# Patient Record
Sex: Female | Born: 1940 | Race: Black or African American | Hispanic: No | Marital: Married | Smoking: Former smoker
Health system: Southern US, Community
[De-identification: ages and names within clinical notes are randomized; demographics above are authoritative.]

## PROBLEM LIST (undated history)

## (undated) DIAGNOSIS — I471 Supraventricular tachycardia: Secondary | ICD-10-CM

## (undated) DIAGNOSIS — I428 Other cardiomyopathies: Secondary | ICD-10-CM

## (undated) DIAGNOSIS — I10 Essential (primary) hypertension: Secondary | ICD-10-CM

## (undated) DIAGNOSIS — M8580 Other specified disorders of bone density and structure, unspecified site: Secondary | ICD-10-CM

## (undated) DIAGNOSIS — D219 Benign neoplasm of connective and other soft tissue, unspecified: Secondary | ICD-10-CM

## (undated) DIAGNOSIS — I340 Nonrheumatic mitral (valve) insufficiency: Secondary | ICD-10-CM

## (undated) DIAGNOSIS — I4719 Other supraventricular tachycardia: Secondary | ICD-10-CM

## (undated) DIAGNOSIS — C801 Malignant (primary) neoplasm, unspecified: Secondary | ICD-10-CM

## (undated) DIAGNOSIS — I447 Left bundle-branch block, unspecified: Secondary | ICD-10-CM

## (undated) DIAGNOSIS — K219 Gastro-esophageal reflux disease without esophagitis: Secondary | ICD-10-CM

## (undated) HISTORY — DX: Left bundle-branch block, unspecified: I44.7

## (undated) HISTORY — DX: Other supraventricular tachycardia: I47.19

## (undated) HISTORY — DX: Gastro-esophageal reflux disease without esophagitis: K21.9

## (undated) HISTORY — DX: Essential (primary) hypertension: I10

## (undated) HISTORY — DX: Supraventricular tachycardia: I47.1

## (undated) HISTORY — PX: TUBAL LIGATION: SHX77

## (undated) HISTORY — DX: Nonrheumatic mitral (valve) insufficiency: I34.0

## (undated) HISTORY — PX: OOPHORECTOMY: SHX86

## (undated) HISTORY — DX: Other specified disorders of bone density and structure, unspecified site: M85.80

## (undated) HISTORY — DX: Malignant (primary) neoplasm, unspecified: C80.1

## (undated) HISTORY — DX: Other cardiomyopathies: I42.8

## (undated) HISTORY — DX: Benign neoplasm of connective and other soft tissue, unspecified: D21.9

---

## 1988-05-11 HISTORY — PX: ABDOMINAL HYSTERECTOMY: SHX81

## 1998-05-08 ENCOUNTER — Other Ambulatory Visit: Admission: RE | Admit: 1998-05-08 | Discharge: 1998-05-08 | Payer: Self-pay | Admitting: Obstetrics and Gynecology

## 1999-05-15 ENCOUNTER — Other Ambulatory Visit: Admission: RE | Admit: 1999-05-15 | Discharge: 1999-05-15 | Payer: Self-pay | Admitting: Obstetrics and Gynecology

## 2000-05-19 ENCOUNTER — Other Ambulatory Visit: Admission: RE | Admit: 2000-05-19 | Discharge: 2000-05-19 | Payer: Self-pay | Admitting: Obstetrics and Gynecology

## 2001-05-27 ENCOUNTER — Other Ambulatory Visit: Admission: RE | Admit: 2001-05-27 | Discharge: 2001-05-27 | Payer: Self-pay | Admitting: Obstetrics and Gynecology

## 2002-05-29 ENCOUNTER — Other Ambulatory Visit: Admission: RE | Admit: 2002-05-29 | Discharge: 2002-05-29 | Payer: Self-pay | Admitting: Obstetrics and Gynecology

## 2003-04-09 ENCOUNTER — Ambulatory Visit (HOSPITAL_COMMUNITY): Admission: RE | Admit: 2003-04-09 | Discharge: 2003-04-09 | Payer: Self-pay | Admitting: Gastroenterology

## 2003-06-26 ENCOUNTER — Other Ambulatory Visit: Admission: RE | Admit: 2003-06-26 | Discharge: 2003-06-26 | Payer: Self-pay | Admitting: Obstetrics and Gynecology

## 2004-05-09 ENCOUNTER — Encounter: Admission: RE | Admit: 2004-05-09 | Discharge: 2004-05-09 | Payer: Self-pay | Admitting: Gastroenterology

## 2004-07-14 ENCOUNTER — Other Ambulatory Visit: Admission: RE | Admit: 2004-07-14 | Discharge: 2004-07-14 | Payer: Self-pay | Admitting: Addiction Medicine

## 2005-07-15 ENCOUNTER — Other Ambulatory Visit: Admission: RE | Admit: 2005-07-15 | Discharge: 2005-07-15 | Payer: Self-pay | Admitting: Obstetrics and Gynecology

## 2006-05-11 DIAGNOSIS — C801 Malignant (primary) neoplasm, unspecified: Secondary | ICD-10-CM

## 2006-05-11 HISTORY — PX: BREAST SURGERY: SHX581

## 2006-05-11 HISTORY — DX: Malignant (primary) neoplasm, unspecified: C80.1

## 2006-07-20 ENCOUNTER — Other Ambulatory Visit: Admission: RE | Admit: 2006-07-20 | Discharge: 2006-07-20 | Payer: Self-pay | Admitting: Obstetrics and Gynecology

## 2006-11-19 ENCOUNTER — Inpatient Hospital Stay (HOSPITAL_COMMUNITY): Admission: RE | Admit: 2006-11-19 | Discharge: 2006-11-22 | Payer: Self-pay | Admitting: Surgery

## 2006-11-19 ENCOUNTER — Encounter (INDEPENDENT_AMBULATORY_CARE_PROVIDER_SITE_OTHER): Payer: Self-pay | Admitting: Surgery

## 2006-12-07 ENCOUNTER — Ambulatory Visit: Payer: Self-pay | Admitting: Oncology

## 2006-12-15 LAB — CBC WITH DIFFERENTIAL/PLATELET
Basophils Absolute: 0 10*3/uL (ref 0.0–0.1)
EOS%: 2.8 % (ref 0.0–7.0)
Eosinophils Absolute: 0.2 10*3/uL (ref 0.0–0.5)
HGB: 10.5 g/dL — ABNORMAL LOW (ref 11.6–15.9)
MCH: 27.3 pg (ref 26.0–34.0)
NEUT#: 5.4 10*3/uL (ref 1.5–6.5)
RDW: 15.7 % — ABNORMAL HIGH (ref 11.3–14.5)
lymph#: 1.5 10*3/uL (ref 0.9–3.3)

## 2006-12-19 LAB — LACTATE DEHYDROGENASE: LDH: 100 U/L (ref 94–250)

## 2006-12-19 LAB — COMPREHENSIVE METABOLIC PANEL
AST: 14 U/L (ref 0–37)
Albumin: 3.8 g/dL (ref 3.5–5.2)
BUN: 11 mg/dL (ref 6–23)
Calcium: 9.5 mg/dL (ref 8.4–10.5)
Chloride: 104 mEq/L (ref 96–112)
Potassium: 3.9 mEq/L (ref 3.5–5.3)
Sodium: 139 mEq/L (ref 135–145)
Total Protein: 7.2 g/dL (ref 6.0–8.3)

## 2007-03-07 ENCOUNTER — Ambulatory Visit (HOSPITAL_COMMUNITY): Admission: RE | Admit: 2007-03-07 | Discharge: 2007-03-07 | Payer: Self-pay | Admitting: Oncology

## 2007-03-08 ENCOUNTER — Encounter: Payer: Self-pay | Admitting: Oncology

## 2007-03-08 ENCOUNTER — Ambulatory Visit: Payer: Self-pay

## 2007-03-23 ENCOUNTER — Ambulatory Visit: Payer: Self-pay | Admitting: Oncology

## 2007-03-25 LAB — COMPREHENSIVE METABOLIC PANEL
AST: 15 U/L (ref 0–37)
Alkaline Phosphatase: 65 U/L (ref 39–117)
BUN: 11 mg/dL (ref 6–23)
Calcium: 9.6 mg/dL (ref 8.4–10.5)
Creatinine, Ser: 0.95 mg/dL (ref 0.40–1.20)
Total Bilirubin: 0.5 mg/dL (ref 0.3–1.2)

## 2007-03-25 LAB — CBC WITH DIFFERENTIAL/PLATELET
BASO%: 0.6 % (ref 0.0–2.0)
Basophils Absolute: 0 10*3/uL (ref 0.0–0.1)
EOS%: 3 % (ref 0.0–7.0)
HCT: 33.6 % — ABNORMAL LOW (ref 34.8–46.6)
HGB: 11.4 g/dL — ABNORMAL LOW (ref 11.6–15.9)
LYMPH%: 21.2 % (ref 14.0–48.0)
MCH: 26 pg (ref 26.0–34.0)
MCHC: 33.8 g/dL (ref 32.0–36.0)
MCV: 76.9 fL — ABNORMAL LOW (ref 81.0–101.0)
MONO%: 5.8 % (ref 0.0–13.0)
NEUT%: 69.4 % (ref 39.6–76.8)

## 2007-03-25 LAB — CANCER ANTIGEN 27.29: CA 27.29: 9 U/mL (ref 0–39)

## 2007-05-26 ENCOUNTER — Ambulatory Visit: Payer: Self-pay | Admitting: Oncology

## 2007-08-09 ENCOUNTER — Other Ambulatory Visit: Admission: RE | Admit: 2007-08-09 | Discharge: 2007-08-09 | Payer: Self-pay | Admitting: Obstetrics and Gynecology

## 2007-09-21 ENCOUNTER — Ambulatory Visit: Payer: Self-pay | Admitting: Oncology

## 2008-03-19 ENCOUNTER — Ambulatory Visit: Payer: Self-pay | Admitting: Oncology

## 2008-03-21 LAB — CBC WITH DIFFERENTIAL/PLATELET
Basophils Absolute: 0 10*3/uL (ref 0.0–0.1)
Eosinophils Absolute: 0.3 10*3/uL (ref 0.0–0.5)
HGB: 12.3 g/dL (ref 11.6–15.9)
LYMPH%: 27.7 % (ref 14.0–48.0)
MCV: 80.8 fL — ABNORMAL LOW (ref 81.0–101.0)
MONO%: 8.5 % (ref 0.0–13.0)
NEUT#: 3.5 10*3/uL (ref 1.5–6.5)
Platelets: 250 10*3/uL (ref 145–400)

## 2008-03-22 LAB — LACTATE DEHYDROGENASE: LDH: 133 U/L (ref 94–250)

## 2008-03-22 LAB — VITAMIN D 25 HYDROXY (VIT D DEFICIENCY, FRACTURES): Vit D, 25-Hydroxy: 26 ng/mL — ABNORMAL LOW (ref 30–89)

## 2008-03-22 LAB — COMPREHENSIVE METABOLIC PANEL
CO2: 20 mEq/L (ref 19–32)
Creatinine, Ser: 0.98 mg/dL (ref 0.40–1.20)
Glucose, Bld: 86 mg/dL (ref 70–99)
Total Bilirubin: 0.5 mg/dL (ref 0.3–1.2)

## 2008-07-11 ENCOUNTER — Ambulatory Visit: Payer: Self-pay | Admitting: Obstetrics and Gynecology

## 2008-08-21 ENCOUNTER — Encounter: Payer: Self-pay | Admitting: Obstetrics and Gynecology

## 2008-08-21 ENCOUNTER — Ambulatory Visit: Payer: Self-pay | Admitting: Obstetrics and Gynecology

## 2008-08-21 ENCOUNTER — Other Ambulatory Visit: Admission: RE | Admit: 2008-08-21 | Discharge: 2008-08-21 | Payer: Self-pay | Admitting: Obstetrics and Gynecology

## 2008-08-29 ENCOUNTER — Ambulatory Visit: Payer: Self-pay | Admitting: Obstetrics and Gynecology

## 2008-09-25 ENCOUNTER — Ambulatory Visit: Payer: Self-pay | Admitting: Oncology

## 2008-09-27 LAB — CBC WITH DIFFERENTIAL/PLATELET
BASO%: 0.1 % (ref 0.0–2.0)
LYMPH%: 37 % (ref 14.0–49.7)
MCHC: 33.4 g/dL (ref 31.5–36.0)
MCV: 79.5 fL (ref 79.5–101.0)
MONO#: 0.4 10*3/uL (ref 0.1–0.9)
MONO%: 5.8 % (ref 0.0–14.0)
Platelets: 249 10*3/uL (ref 145–400)
RBC: 4.63 10*6/uL (ref 3.70–5.45)
WBC: 7.1 10*3/uL (ref 3.9–10.3)
nRBC: 0 % (ref 0–0)

## 2008-09-28 LAB — COMPREHENSIVE METABOLIC PANEL
ALT: 10 U/L (ref 0–35)
AST: 16 U/L (ref 0–37)
Alkaline Phosphatase: 65 U/L (ref 39–117)
Creatinine, Ser: 0.96 mg/dL (ref 0.40–1.20)
Total Bilirubin: 0.3 mg/dL (ref 0.3–1.2)

## 2008-10-02 ENCOUNTER — Ambulatory Visit (HOSPITAL_COMMUNITY): Admission: RE | Admit: 2008-10-02 | Discharge: 2008-10-02 | Payer: Self-pay | Admitting: Oncology

## 2009-03-29 ENCOUNTER — Ambulatory Visit: Payer: Self-pay | Admitting: Oncology

## 2009-04-02 LAB — CBC WITH DIFFERENTIAL/PLATELET
Basophils Absolute: 0 10*3/uL (ref 0.0–0.1)
EOS%: 4.9 % (ref 0.0–7.0)
Eosinophils Absolute: 0.3 10*3/uL (ref 0.0–0.5)
HGB: 11.9 g/dL (ref 11.6–15.9)
MONO#: 0.4 10*3/uL (ref 0.1–0.9)
NEUT#: 3.8 10*3/uL (ref 1.5–6.5)
RDW: 15.5 % — ABNORMAL HIGH (ref 11.2–14.5)
WBC: 6.6 10*3/uL (ref 3.9–10.3)
lymph#: 2 10*3/uL (ref 0.9–3.3)

## 2009-04-03 LAB — COMPREHENSIVE METABOLIC PANEL
AST: 17 U/L (ref 0–37)
Albumin: 4.5 g/dL (ref 3.5–5.2)
BUN: 14 mg/dL (ref 6–23)
Calcium: 9.6 mg/dL (ref 8.4–10.5)
Chloride: 104 mEq/L (ref 96–112)
Glucose, Bld: 97 mg/dL (ref 70–99)
Potassium: 4.1 mEq/L (ref 3.5–5.3)

## 2009-08-26 ENCOUNTER — Ambulatory Visit: Payer: Self-pay | Admitting: Obstetrics and Gynecology

## 2009-09-19 ENCOUNTER — Ambulatory Visit: Payer: Self-pay | Admitting: Obstetrics and Gynecology

## 2009-09-26 ENCOUNTER — Ambulatory Visit: Payer: Self-pay | Admitting: Obstetrics and Gynecology

## 2010-02-14 ENCOUNTER — Inpatient Hospital Stay (HOSPITAL_COMMUNITY): Admission: EM | Admit: 2010-02-14 | Discharge: 2010-02-17 | Payer: Self-pay | Admitting: Internal Medicine

## 2010-02-15 ENCOUNTER — Encounter (INDEPENDENT_AMBULATORY_CARE_PROVIDER_SITE_OTHER): Payer: Self-pay | Admitting: Interventional Cardiology

## 2010-03-05 ENCOUNTER — Encounter (INDEPENDENT_AMBULATORY_CARE_PROVIDER_SITE_OTHER): Payer: Self-pay | Admitting: Interventional Cardiology

## 2010-03-05 ENCOUNTER — Ambulatory Visit (HOSPITAL_COMMUNITY): Admission: RE | Admit: 2010-03-05 | Discharge: 2010-03-05 | Payer: Self-pay | Admitting: Interventional Cardiology

## 2010-03-31 ENCOUNTER — Ambulatory Visit: Payer: Self-pay | Admitting: Oncology

## 2010-04-02 LAB — CBC WITH DIFFERENTIAL/PLATELET
BASO%: 0.4 % (ref 0.0–2.0)
Basophils Absolute: 0 10*3/uL (ref 0.0–0.1)
EOS%: 4.4 % (ref 0.0–7.0)
Eosinophils Absolute: 0.2 10*3/uL (ref 0.0–0.5)
HCT: 35.1 % (ref 34.8–46.6)
HGB: 11.5 g/dL — ABNORMAL LOW (ref 11.6–15.9)
LYMPH%: 38.1 % (ref 14.0–49.7)
MCH: 26.3 pg (ref 25.1–34.0)
MCHC: 32.8 g/dL (ref 31.5–36.0)
MCV: 80.1 fL (ref 79.5–101.0)
MONO#: 0.5 10*3/uL (ref 0.1–0.9)
MONO%: 9.2 % (ref 0.0–14.0)
NEUT#: 2.4 10*3/uL (ref 1.5–6.5)
NEUT%: 47.9 % (ref 38.4–76.8)
Platelets: 191 10*3/uL (ref 145–400)
RBC: 4.38 10*6/uL (ref 3.70–5.45)
RDW: 15.3 % — ABNORMAL HIGH (ref 11.2–14.5)
WBC: 5 10*3/uL (ref 3.9–10.3)
lymph#: 1.9 10*3/uL (ref 0.9–3.3)
nRBC: 0 % (ref 0–0)

## 2010-04-03 LAB — COMPREHENSIVE METABOLIC PANEL
ALT: 11 U/L (ref 0–35)
AST: 20 U/L (ref 0–37)
Albumin: 4 g/dL (ref 3.5–5.2)
Alkaline Phosphatase: 50 U/L (ref 39–117)
BUN: 15 mg/dL (ref 6–23)
CO2: 23 mEq/L (ref 19–32)
Calcium: 9.2 mg/dL (ref 8.4–10.5)
Chloride: 106 mEq/L (ref 96–112)
Creatinine, Ser: 1.26 mg/dL — ABNORMAL HIGH (ref 0.40–1.20)
Glucose, Bld: 110 mg/dL — ABNORMAL HIGH (ref 70–99)
Potassium: 4 mEq/L (ref 3.5–5.3)
Sodium: 140 mEq/L (ref 135–145)
Total Bilirubin: 0.2 mg/dL — ABNORMAL LOW (ref 0.3–1.2)
Total Protein: 6.7 g/dL (ref 6.0–8.3)

## 2010-04-03 LAB — VITAMIN D 25 HYDROXY (VIT D DEFICIENCY, FRACTURES): Vit D, 25-Hydroxy: 40 ng/mL (ref 30–89)

## 2010-04-03 LAB — CANCER ANTIGEN 27.29: CA 27.29: 13 U/mL (ref 0–39)

## 2010-04-03 LAB — LACTATE DEHYDROGENASE: LDH: 123 U/L (ref 94–250)

## 2010-06-01 ENCOUNTER — Encounter: Payer: Self-pay | Admitting: Surgery

## 2010-06-01 ENCOUNTER — Encounter: Payer: Self-pay | Admitting: Oncology

## 2010-07-24 LAB — BRAIN NATRIURETIC PEPTIDE
Pro B Natriuretic peptide (BNP): 305 pg/mL — ABNORMAL HIGH (ref 0.0–100.0)
Pro B Natriuretic peptide (BNP): 821 pg/mL — ABNORMAL HIGH (ref 0.0–100.0)

## 2010-07-24 LAB — IGG, IGA, IGM
IgA: 442 mg/dL — ABNORMAL HIGH (ref 68–378)
IgG (Immunoglobin G), Serum: 1380 mg/dL (ref 694–1618)
IgM, Serum: 81 mg/dL (ref 60–263)

## 2010-07-24 LAB — CARDIAC PANEL(CRET KIN+CKTOT+MB+TROPI)
CK, MB: 1.5 ng/mL (ref 0.3–4.0)
CK, MB: 1.8 ng/mL (ref 0.3–4.0)
CK, MB: 1.8 ng/mL (ref 0.3–4.0)
Relative Index: 1 (ref 0.0–2.5)
Relative Index: 1.2 (ref 0.0–2.5)
Relative Index: 1.3 (ref 0.0–2.5)
Total CK: 142 U/L (ref 7–177)
Total CK: 152 U/L (ref 7–177)
Total CK: 152 U/L (ref 7–177)
Troponin I: 0.03 ng/mL (ref 0.00–0.06)
Troponin I: 0.04 ng/mL (ref 0.00–0.06)
Troponin I: 0.05 ng/mL (ref 0.00–0.06)

## 2010-07-24 LAB — COMPREHENSIVE METABOLIC PANEL
ALT: 53 U/L — ABNORMAL HIGH (ref 0–35)
AST: 46 U/L — ABNORMAL HIGH (ref 0–37)
Albumin: 3.9 g/dL (ref 3.5–5.2)
Alkaline Phosphatase: 59 U/L (ref 39–117)
BUN: 10 mg/dL (ref 6–23)
CO2: 26 mEq/L (ref 19–32)
Calcium: 9.9 mg/dL (ref 8.4–10.5)
Chloride: 109 mEq/L (ref 96–112)
Creatinine, Ser: 1.15 mg/dL (ref 0.4–1.2)
GFR calc Af Amer: 57 mL/min — ABNORMAL LOW (ref >60)
GFR calc non Af Amer: 47 mL/min — ABNORMAL LOW (ref >60)
Glucose, Bld: 102 mg/dL — ABNORMAL HIGH (ref 70–99)
Potassium: 4.6 mEq/L (ref 3.5–5.1)
Sodium: 143 mEq/L (ref 135–145)
Total Bilirubin: 0.5 mg/dL (ref 0.3–1.2)
Total Protein: 7.3 g/dL (ref 6.0–8.3)

## 2010-07-24 LAB — CBC
HCT: 37.8 % (ref 36.0–46.0)
Hemoglobin: 12.2 g/dL (ref 12.0–15.0)
MCH: 26.6 pg (ref 26.0–34.0)
MCHC: 32.3 g/dL (ref 30.0–36.0)
MCV: 82.5 fL (ref 78.0–100.0)
Platelets: 270 10*3/uL (ref 150–400)
RBC: 4.58 MIL/uL (ref 3.87–5.11)
RDW: 16 % — ABNORMAL HIGH (ref 11.5–15.5)
WBC: 7.2 10*3/uL (ref 4.0–10.5)

## 2010-07-24 LAB — IRON AND TIBC
Iron: 46 ug/dL (ref 42–135)
Saturation Ratios: 14 % — ABNORMAL LOW (ref 20–55)
TIBC: 319 ug/dL (ref 250–470)
UIBC: 273 ug/dL

## 2010-07-24 LAB — BASIC METABOLIC PANEL
BUN: 19 mg/dL (ref 6–23)
CO2: 25 mEq/L (ref 19–32)
CO2: 25 mEq/L (ref 19–32)
Calcium: 9.6 mg/dL (ref 8.4–10.5)
Calcium: 9.8 mg/dL (ref 8.4–10.5)
Chloride: 108 mEq/L (ref 96–112)
Creatinine, Ser: 1.25 mg/dL — ABNORMAL HIGH (ref 0.4–1.2)
Creatinine, Ser: 1.28 mg/dL — ABNORMAL HIGH (ref 0.4–1.2)
GFR calc Af Amer: 50 mL/min — ABNORMAL LOW (ref >60)
GFR calc Af Amer: 52 mL/min — ABNORMAL LOW (ref >60)
GFR calc non Af Amer: 41 mL/min — ABNORMAL LOW (ref >60)
GFR calc non Af Amer: 43 mL/min — ABNORMAL LOW (ref >60)
Glucose, Bld: 97 mg/dL (ref 70–99)
Potassium: 4.2 mEq/L (ref 3.5–5.1)
Sodium: 139 mEq/L (ref 135–145)
Sodium: 140 mEq/L (ref 135–145)

## 2010-07-24 LAB — PROTEIN ELECTROPH W RFLX QUANT IMMUNOGLOBULINS
Albumin ELP: 53 % — ABNORMAL LOW (ref 55.8–66.1)
Alpha-1-Globulin: 5.6 % — ABNORMAL HIGH (ref 2.9–4.9)
Alpha-2-Globulin: 12 % — ABNORMAL HIGH (ref 7.1–11.8)
Beta 2: 5.4 % (ref 3.2–6.5)
Beta Globulin: 7.5 % — ABNORMAL HIGH (ref 4.7–7.2)
Gamma Globulin: 16.5 % (ref 11.1–18.8)
M-Spike, %: NOT DETECTED g/dL
Total Protein ELP: 7.6 g/dL (ref 6.0–8.3)

## 2010-07-24 LAB — UIFE/LIGHT CHAINS/TP QN, 24-HR UR
Alpha 1, Urine: DETECTED — AB
Free Kappa Lt Chains,Ur: 3.4 mg/dL — ABNORMAL HIGH (ref 0.04–1.51)
Gamma Globulin, Urine: NOT DETECTED

## 2010-07-24 LAB — IMMUNOFIXATION ADD-ON

## 2010-07-24 LAB — TSH: TSH: 2.002 u[IU]/mL (ref 0.350–4.500)

## 2010-09-08 ENCOUNTER — Encounter: Payer: MEDICARE | Admitting: Obstetrics and Gynecology

## 2010-09-16 DIAGNOSIS — Z1211 Encounter for screening for malignant neoplasm of colon: Secondary | ICD-10-CM

## 2010-09-17 ENCOUNTER — Encounter (INDEPENDENT_AMBULATORY_CARE_PROVIDER_SITE_OTHER): Payer: MEDICARE | Admitting: Obstetrics and Gynecology

## 2010-09-17 ENCOUNTER — Other Ambulatory Visit (HOSPITAL_COMMUNITY)
Admission: RE | Admit: 2010-09-17 | Discharge: 2010-09-17 | Disposition: A | Discharge status: Home / Self Care | Payer: MEDICARE | Source: Ambulatory Visit | Attending: Obstetrics and Gynecology | Admitting: Obstetrics and Gynecology

## 2010-09-17 ENCOUNTER — Other Ambulatory Visit: Payer: Self-pay | Admitting: Obstetrics and Gynecology

## 2010-09-17 DIAGNOSIS — Z124 Encounter for screening for malignant neoplasm of cervix: Secondary | ICD-10-CM

## 2010-09-17 DIAGNOSIS — N760 Acute vaginitis: Secondary | ICD-10-CM

## 2010-09-17 DIAGNOSIS — N951 Menopausal and female climacteric states: Secondary | ICD-10-CM

## 2010-09-17 DIAGNOSIS — C50919 Malignant neoplasm of unspecified site of unspecified female breast: Secondary | ICD-10-CM

## 2010-09-17 DIAGNOSIS — M949 Disorder of cartilage, unspecified: Secondary | ICD-10-CM

## 2010-09-18 ENCOUNTER — Encounter: Payer: Self-pay | Admitting: Obstetrics and Gynecology

## 2010-09-23 NOTE — Op Note (Signed)
Jacqueline Murphy, KINNER NO.:  [id number]   MEDICAL RECORD NO.:  [id number]          PATIENT TYPE:  INP   LOCATION:  5743                         FACILITY:  [facility]   PHYSICIAN:  Yaakov Guthrie. Shon Hough, M.D.DATE OF BIRTH:  1940/09/16   DATE OF PROCEDURE:  11/19/2006  DATE OF DISCHARGE:                               OPERATIVE REPORT   This is a 70 year old lady with left breast cancer.  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   PROCEDURES PLANNED:  Left mastectomy with Dr. Jamey Ripa and reconstruction  of that area with the tissue expander by myself as well as reduction of  the right breast for symmetry and functional.   The patient also has accessory breast tissue in the right upper inner  quadrant region and axillary latissimus dorsi areas and that is going to  be removed surgically as well.   Procedure in detail is explained to the patient preoperatively.  The  patient came by the office yesterday for drawing and all questions were  answered as well as possible risks, complications explained.  The  patient consents to surgery.  While having had the mastectomy,  reprepping was done in some areas with Betadine soaping solution and  walled off with sterile towels and drapes, thus making a sterile field.  The right breast reduction was done actually first.  The wound was  scored with a #15 blade.  Skin over the inferior part was de-  epithelialized with a #20 blade.  Medial and lateral fatty dermal  pedicles were incised down to fascia out laterally, more accessory  breast tissue was removed.  Liposuction assistance also done in the  upper axillary areas.  The new keyhole area was debulked and then the  flaps transposed and stayed with 3-0 Prolene.  Subcutaneous closed down  with 3-0 Monocryl x2 layers.  Then running subcuticular stitches of 3-0  Monocryl and 5-0 Monocryl throughout the inverted T.  Wounds were  drained with a #10 Blake drain fully  fluted and placed in the depths of  the wound and brought out through a lateral lower portion of the  incision and secured with 3-0 Prolene.   Next, attention was drawn to the left side.  A flap was made between the  edge of the pectoralis major muscle and the serratus anterior.  Dissection was carried under the pectoralis major down to the 5th rib,  then used in light of retraction.  I was able to dissect the muscle off  the 5th rib down to the previously drawn inframammary folds.  Laterally,  medially, superiorly, flaps were freed up.  Hemostasis was maintained  with the Bovie unit coagulation.   Next, the space was filled with a Mentor tissue expander.  We filled it  up to approximately 400 mL with excellent symmetry.  Muscles repaired  with 2-0 Monocryl, subcutaneous tissue with 2-0 Monocryl x2 layers, then  running subcuticular stitches of 3-0 Monocryl.  The wounds were drained  with #10 Blake drains placed in the depths of the wound.  Tissue  expander port was placed in the upper axillary area and  tacked with 3-0  Monocryl for future injections.  All of the wounds were cleansed, 1/2  inch Steri-Strips and soft bulky dressings were applied to all the  areas.  She withstood the procedures very well.   ESTIMATED BLOOD LOSS:  Less than 150 mL.   COMPLICATIONS:  None.      Yaakov Guthrie. Shon Hough, M.D.  Electronically Signed     GLT/MEDQ  D:  11/19/2006  T:  11/20/2006  Job:  [id number]

## 2010-09-23 NOTE — Op Note (Signed)
Jacqueline Murphy, Jacqueline Murphy NO.:  [id number]   MEDICAL RECORD NO.:  [id number]          PATIENT TYPE:  INP   LOCATION:  5743                         FACILITY:  [facility]   PHYSICIAN:  Currie Paris, M.D.DATE OF BIRTH:  Mar 09, 1941   DATE OF PROCEDURE:  11/19/2006  DATE OF DISCHARGE:                               OPERATIVE REPORT   PREOPERATIVE DIAGNOSIS:  Multifocal left breast cancer.   POSTOPERATIVE DIAGNOSIS:  Multifocal left breast cancer.   OPERATION:  Left total mastectomy with sentinel node biopsy and blue dye  injection.   SURGEON:  Currie Paris, M.D.   ASSISTANT:  Sandria Bales. Ezzard Standing, M.D.   ANESTHESIA:  General.   CLINICAL HISTORY:  Jacqueline Murphy is a 70 year old lady recently found to  have multifocal left breast cancer.  After a lengthy discussion with the  patient, she elected to proceed with mastectomy.  She also planned for  reconstruction by Dr. Shon Hough with a reduction on the opposite side.   DESCRIPTION OF PROCEDURE:  The patient was seen in the holding area and  confirmed plans for surgery as noted above.  I initialed and marked the  left breast as the operative side for the sentinel node as did the  patient.  She had already had her radioactive injection.   The patient was taken to the operating room. After satisfactory general  anesthesia had been obtained, both breasts and the upper abdomen were  prepped and draped as a sterile field.  I had already remarked Dr.  Charlesetta Garibaldi marks so we did not loose them during the prep.  I outlined  an elliptical incision on the breast.  Initially, the Neoprobe was not  functioning, so I was not able to mark the overlying skin for the  sentinel node.  However, I went ahead and made the superior incision and  raised the skin flap superiorly to the clavicle and medially to the  sternum and laterally into the axilla.  Dissection in the axilla showed  no blue dye and I postponed the axillary approach  for a few minutes.  I  then made the inferior incision and began to raise the inferior flap.  When I got the flap about half done, we were able to get the Neoprobe  functioning.  Using this to examine the axillary contents, I found a hot  area and with dissection found a large blue node which had counts of  about 85 with backgrounds of about 0 to 5.  Once I removed this, there  were no other hot areas and I saw no other evidence of blue dye.  There  was no palpable adenopathy.   Attention was turned back to the flaps and the inferior flap was  completed going up the latissimus.  The breast was then removed from  muscle starting medially working laterally and finally disconnecting its  lateral attachments to the latissimus.  I used the Neoprobe one final  time making sure there were no hot areas in the axillary contents  remaining nor in the axillary tail of the breast and I saw no hot areas  and identified no other  blue dye.   I irrigated and made sure everything was dry.  I then put some saline  packs in and Dr. Shon Hough came in to do his portion of the case.  The  patient and procedure well.  There were no operative complications.  Estimated loss for my portion was less than 50 mL.      Currie Paris, M.D.  Electronically Signed     CJS/MEDQ  D:  11/19/2006  T:  11/20/2006  Job:  [id number]   cc:   Reuel Boom L. Eda Paschal, M.D.  Yaakov Guthrie. Shon Hough, M.D.

## 2010-09-23 NOTE — Discharge Summary (Signed)
Jacqueline Murphy, Jacqueline Murphy NO.:  [id number]   MEDICAL RECORD NO.:  [id number]          PATIENT TYPE:  INP   LOCATION:  5743                         FACILITY:  [facility]   PHYSICIAN:  Currie Paris, M.D.DATE OF BIRTH:  1940/12/17   DATE OF ADMISSION:  11/19/2006  DATE OF DISCHARGE:  11/22/2006                               DISCHARGE SUMMARY   OFFICE MEDICAL RECORD NUMBER:  [id number].   FINAL DIAGNOSIS:  Carcinoma of the left breast.   CLINICAL HISTORY:  Ms. Rowand is a 70 year old lady who has recently  presented with a left breast cancer.  It appeared to be multifocal.  She  elected therefore to have a left mastectomy with sentinel node  evaluation.  She elected also to have a reconstruction with a right  reduction.   HOSPITAL COURSE:  The patient was admitted and taken to the operating  room.  She underwent a left total mastectomy with reconstruction and a  right reduction mammoplasty.  She tolerated the procedure well.  Postoperatively, she was maintained in the hospital for a few days for  pain and wound management.  She got a little bit deoxygenated the day  following surgery, and this improved with pulmonary toilette.  CT scan  was done to rule out a pulmonary embolus.  No PE was found.  Some  thickening was incidentally noted of the stomach which will be followed  as an outpatient.   The patient gradually improved and by November 22, 2006 was able to be  discharged.  She is to be followed up in my office as well as by Dr.  Shon Hough.   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  The right  reduction mammoplasty showed no atypical hyperplasia or other  abnormality.  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  Other laboratory  studies included a hemoglobin of 13.4 preoperatively which drifted to  9.8.  Electrolytes were normal.      Currie Paris, M.D.  Electronically Signed     CJS/MEDQ  D:  12/22/2006  T:  12/23/2006  Job:   [id number]   cc:   Reuel Boom L. Eda Paschal, M.D.  Yaakov Guthrie. Shon Hough, M.D.

## 2010-09-26 NOTE — Op Note (Signed)
NAME:  Jacqueline Murphy, Jacqueline Murphy                   ACCOUNT NO.:  [id number]   MEDICAL RECORD NO.:  [id number]                   PATIENT TYPE:  AMB   LOCATION:  ENDO                                 FACILITY:  [facility]   PHYSICIAN:  James L. Malon Kindle., M.D.          DATE OF BIRTH:  07-Apr-1941   DATE OF PROCEDURE:  04/09/2003  DATE OF DISCHARGE:                                 OPERATIVE REPORT   PROCEDURE:  Colonoscopy.   MEDICATIONS:  Fentanyl 100 mcg, Versed 9 mg IV.   SCOPE:  Olympus pediatric adjustable colonoscope.   INDICATIONS:  Colon cancer screening.   DESCRIPTION OF PROCEDURE:  The procedure had been explained to the patient  and consent obtained.  With the patient in the left lateral decubitus  position, the Olympus scope was inserted and advanced.  The prep was  excellent.  We were able to advance to the cecum using abdominal pressure  and position changes.  The cecum was seen well and was normal.  The  scope  was withdrawn and the cecum, ascending colon, transverse colon, descending  and sigmoid colon were seen well.  No polyps were seen.  Minimal  diverticular disease, if any.  The scope was withdrawn into the rectum.  The  rectum was free of polyps.  The scope was withdrawn. The patient tolerated  the procedure well.   ASSESSMENT:  Normal screening colonoscopy.  V76.51.   PLAN:  Will recommend yearly hemoccults and repeat procedure in 5-10 years.                                               James L. Malon Kindle., M.D.    Waldron Session  D:  04/09/2003  T:  04/09/2003  Job:  [id number]   cc:   Lilla Shook, M.D.  [address], [address]  [place]  [place] [address]  Fax: [phone]

## 2010-10-03 ENCOUNTER — Encounter (HOSPITAL_BASED_OUTPATIENT_CLINIC_OR_DEPARTMENT_OTHER): Payer: MEDICARE | Admitting: Oncology

## 2010-10-03 ENCOUNTER — Other Ambulatory Visit: Payer: Self-pay | Admitting: Oncology

## 2010-10-03 DIAGNOSIS — C50919 Malignant neoplasm of unspecified site of unspecified female breast: Secondary | ICD-10-CM

## 2010-10-03 DIAGNOSIS — Z171 Estrogen receptor negative status [ER-]: Secondary | ICD-10-CM

## 2010-10-03 LAB — CBC WITH DIFFERENTIAL/PLATELET
Basophils Absolute: 0 10*3/uL (ref 0.0–0.1)
EOS%: 5.9 % (ref 0.0–7.0)
Eosinophils Absolute: 0.4 10*3/uL (ref 0.0–0.5)
HGB: 11.6 g/dL (ref 11.6–15.9)
LYMPH%: 27.8 % (ref 14.0–49.7)
MCH: 27.4 pg (ref 25.1–34.0)
MCV: 83.2 fL (ref 79.5–101.0)
MONO%: 8.8 % (ref 0.0–14.0)
NEUT#: 3.5 10*3/uL (ref 1.5–6.5)
Platelets: 225 10*3/uL (ref 145–400)
RBC: 4.21 10*6/uL (ref 3.70–5.45)

## 2010-10-04 LAB — COMPREHENSIVE METABOLIC PANEL
Alkaline Phosphatase: 52 U/L (ref 39–117)
BUN: 22 mg/dL (ref 6–23)
Glucose, Bld: 92 mg/dL (ref 70–99)
Total Bilirubin: 0.3 mg/dL (ref 0.3–1.2)

## 2010-10-04 LAB — VITAMIN D 25 HYDROXY (VIT D DEFICIENCY, FRACTURES): Vit D, 25-Hydroxy: 48 ng/mL (ref 30–89)

## 2010-10-04 LAB — CANCER ANTIGEN 27.29: CA 27.29: 14 U/mL (ref 0–39)

## 2010-10-10 ENCOUNTER — Encounter (HOSPITAL_BASED_OUTPATIENT_CLINIC_OR_DEPARTMENT_OTHER): Payer: MEDICARE | Admitting: Oncology

## 2010-10-10 DIAGNOSIS — C50919 Malignant neoplasm of unspecified site of unspecified female breast: Secondary | ICD-10-CM

## 2010-10-27 ENCOUNTER — Ambulatory Visit (INDEPENDENT_AMBULATORY_CARE_PROVIDER_SITE_OTHER): Payer: MEDICARE | Admitting: Obstetrics and Gynecology

## 2010-10-27 ENCOUNTER — Other Ambulatory Visit: Payer: Self-pay | Admitting: Obstetrics and Gynecology

## 2010-10-27 ENCOUNTER — Other Ambulatory Visit (HOSPITAL_COMMUNITY)
Admission: RE | Admit: 2010-10-27 | Discharge: 2010-10-27 | Disposition: A | Discharge status: Home / Self Care | Payer: MEDICARE | Source: Ambulatory Visit | Attending: Obstetrics and Gynecology | Admitting: Obstetrics and Gynecology

## 2010-10-27 DIAGNOSIS — Z9189 Other specified personal risk factors, not elsewhere classified: Secondary | ICD-10-CM

## 2010-10-27 DIAGNOSIS — R87619 Unspecified abnormal cytological findings in specimens from cervix uteri: Secondary | ICD-10-CM | POA: Insufficient documentation

## 2010-10-27 DIAGNOSIS — N952 Postmenopausal atrophic vaginitis: Secondary | ICD-10-CM

## 2011-02-24 LAB — CBC
HCT: 29.4 — ABNORMAL LOW
HCT: 40.6
Hemoglobin: 9.8 — ABNORMAL LOW
MCHC: 33.3
MCV: 80.4
Platelets: 277
RBC: 3.65 — ABNORMAL LOW
RDW: 15 — ABNORMAL HIGH
WBC: 7.7
WBC: 8.6

## 2011-02-24 LAB — COMPREHENSIVE METABOLIC PANEL
AST: 19
AST: 24
Albumin: 3.7
BUN: 15
BUN: 6
CO2: 26
Calcium: 8.7
Chloride: 103
Chloride: 104
Creatinine, Ser: 0.96
Creatinine, Ser: 0.96
GFR calc Af Amer: >60
GFR calc Af Amer: >60
GFR calc non Af Amer: 58 — ABNORMAL LOW
Glucose, Bld: 244 — ABNORMAL HIGH
Potassium: 4.9
Total Bilirubin: 0.5
Total Bilirubin: 0.8
Total Protein: 8

## 2011-02-24 LAB — URINALYSIS, ROUTINE W REFLEX MICROSCOPIC
Bilirubin Urine: NEGATIVE
Hgb urine dipstick: NEGATIVE
Ketones, ur: NEGATIVE
Nitrite: NEGATIVE
Specific Gravity, Urine: 1.006
pH: 6.5

## 2011-02-24 LAB — URINE MICROSCOPIC-ADD ON

## 2011-02-24 LAB — DIFFERENTIAL
Eosinophils Relative: 5
Lymphocytes Relative: 33
Monocytes Absolute: 0.5
Monocytes Relative: 6
Neutro Abs: 4.3

## 2011-02-24 LAB — HEMOGLOBIN AND HEMATOCRIT, BLOOD
HCT: 16.6 — ABNORMAL LOW
Hemoglobin: 10.4 — ABNORMAL LOW
Hemoglobin: 4.7 — CL

## 2011-02-24 LAB — APTT: aPTT: 29

## 2011-04-10 ENCOUNTER — Other Ambulatory Visit: Payer: Self-pay | Admitting: Oncology

## 2011-04-10 ENCOUNTER — Other Ambulatory Visit (HOSPITAL_BASED_OUTPATIENT_CLINIC_OR_DEPARTMENT_OTHER): Payer: MEDICARE | Admitting: Lab

## 2011-04-10 DIAGNOSIS — E559 Vitamin D deficiency, unspecified: Secondary | ICD-10-CM

## 2011-04-10 DIAGNOSIS — C50919 Malignant neoplasm of unspecified site of unspecified female breast: Secondary | ICD-10-CM

## 2011-04-10 LAB — CBC WITH DIFFERENTIAL/PLATELET
BASO%: 0.3 % (ref 0.0–2.0)
Basophils Absolute: 0 10*3/uL (ref 0.0–0.1)
EOS%: 5.1 % (ref 0.0–7.0)
HGB: 12.3 g/dL (ref 11.6–15.9)
MCH: 26.3 pg (ref 25.1–34.0)
MONO#: 0.4 10*3/uL (ref 0.1–0.9)
RDW: 15.2 % — ABNORMAL HIGH (ref 11.2–14.5)
WBC: 6.1 10*3/uL (ref 3.9–10.3)
lymph#: 1.9 10*3/uL (ref 0.9–3.3)

## 2011-04-11 LAB — COMPREHENSIVE METABOLIC PANEL
ALT: 10 U/L (ref 0–35)
AST: 21 U/L (ref 0–37)
Albumin: 4.2 g/dL (ref 3.5–5.2)
BUN: 18 mg/dL (ref 6–23)
CO2: 25 mEq/L (ref 19–32)
Calcium: 9.5 mg/dL (ref 8.4–10.5)
Chloride: 104 mEq/L (ref 96–112)
Potassium: 4.2 mEq/L (ref 3.5–5.3)

## 2011-04-13 ENCOUNTER — Ambulatory Visit (HOSPITAL_BASED_OUTPATIENT_CLINIC_OR_DEPARTMENT_OTHER): Payer: MEDICARE | Admitting: Oncology

## 2011-04-13 ENCOUNTER — Telehealth: Payer: Self-pay | Admitting: *Deleted

## 2011-04-13 VITALS — BP 161/78 | HR 78 | Temp 98.1°F | Ht 64.5 in | Wt 166.3 lb

## 2011-04-13 DIAGNOSIS — E559 Vitamin D deficiency, unspecified: Secondary | ICD-10-CM

## 2011-04-13 DIAGNOSIS — C50919 Malignant neoplasm of unspecified site of unspecified female breast: Secondary | ICD-10-CM

## 2011-04-13 NOTE — Progress Notes (Signed)
Hematology and Oncology Follow Up Visit  Jacqueline Murphy [id number] February 13, 1941 70 y.o. 04/13/2011 12:33 PM   Principle Diagnosis: 70 yo with er+ T1a breast cancer, s/p surgery 2008, on observation  Interim History:  Doing well, no intercurrent pblms, no recurrence of palpitations. Last mammogram 7/12  Medications: I have reviewed the patient's current medications. Coreg started 10/11  Allergies: No Known Allergies  Past Medical History, Surgical history, Social history, and Family History were reviewed and updated.  Review of Systems: Constitutional:  Negative for fever, chills, night sweats, anorexia, weight loss, pain. Cardiovascular: no chest pain or dyspnea on exertion Respiratory: no cough, shortness of breath, or wheezing Neurological: no TIA or stroke symptoms Dermatological: negative ENT: negative Skin Gastrointestinal: no abdominal pain, change in bowel habits, or black or bloody stools Genito-Urinary: no dysuria, trouble voiding, or hematuria Hematological and Lymphatic: negative Breast: negative for breast lumps Musculoskeletal: negative Remaining ROS negative.  Physical Exam: Blood pressure 161/78, pulse 78, temperature 98.1 F (36.7 C), height 5' 4.5" (1.638 m), weight 166 lb 4.8 oz (75.433 kg). ECOG:  General appearance: alert, cooperative and appears stated age Head: Normocephalic, without obvious abnormality, atraumatic Neck: no adenopathy, no carotid bruit, no JVD, supple, symmetrical, trachea midline and thyroid not enlarged, symmetric, no tenderness/mass/nodules Lymph nodes: Cervical, supraclavicular, and axillary nodes normal. Cardiac : nl Pulmonary:nl Breasts: rt breast normal, lt side s/p mrm, NED; both axilla neg Abdomen:nl Extremities nl Neuro:nl  Lab Results: Lab Results  Component Value Date   WBC 6.1 04/10/2011   HGB 12.3 04/10/2011   HCT 38.0 04/10/2011   MCV 81.4 04/10/2011   PLT 251 04/10/2011     Chemistry      Component  Value Date/Time   NA 139 04/10/2011 0947   NA 139 04/10/2011 0947   NA 139 04/10/2011 0947   K 4.2 04/10/2011 0947   K 4.2 04/10/2011 0947   K 4.2 04/10/2011 0947   CL 104 04/10/2011 0947   CL 104 04/10/2011 0947   CL 104 04/10/2011 0947   CO2 25 04/10/2011 0947   CO2 25 04/10/2011 0947   CO2 25 04/10/2011 0947   BUN 18 04/10/2011 0947   BUN 18 04/10/2011 0947   BUN 18 04/10/2011 0947   CREATININE 1.06 04/10/2011 0947   CREATININE 1.06 04/10/2011 0947   CREATININE 1.06 04/10/2011 0947      Component Value Date/Time   CALCIUM 9.5 04/10/2011 0947   CALCIUM 9.5 04/10/2011 0947   CALCIUM 9.5 04/10/2011 0947   ALKPHOS 52 04/10/2011 0947   ALKPHOS 52 04/10/2011 0947   ALKPHOS 52 04/10/2011 0947   AST 21 04/10/2011 0947   AST 21 04/10/2011 0947   AST 21 04/10/2011 0947   ALT 10 04/10/2011 0947   ALT 10 04/10/2011 0947   ALT 10 04/10/2011 0947   BILITOT 0.4 04/10/2011 0947   BILITOT 0.4 04/10/2011 0947   BILITOT 0.4 04/10/2011 0947       Radiological Studies: chest X-ray n/a Mammogram 7/12- nl Bone density 5/11-nl  Impression and Plan: Pt is doing well, w/out evidence of local or distant recurrence; f/u 1 yr.  More than 50% of the visit was spent in patient-related counselling   Pierce Crane, MD 12/3/201212:33 PM

## 2011-04-13 NOTE — Telephone Encounter (Signed)
GAVE PATIENT APPOINTMENT FOR 04-2012 PRINTED OUT CALENDAR AND GAVE TO THE PATIENT

## 2011-10-20 ENCOUNTER — Encounter: Payer: Self-pay | Admitting: Gynecology

## 2011-10-20 DIAGNOSIS — C801 Malignant (primary) neoplasm, unspecified: Secondary | ICD-10-CM | POA: Insufficient documentation

## 2011-10-20 DIAGNOSIS — M8580 Other specified disorders of bone density and structure, unspecified site: Secondary | ICD-10-CM | POA: Insufficient documentation

## 2011-10-20 DIAGNOSIS — D219 Benign neoplasm of connective and other soft tissue, unspecified: Secondary | ICD-10-CM | POA: Insufficient documentation

## 2011-10-21 ENCOUNTER — Other Ambulatory Visit: Payer: Self-pay | Admitting: Obstetrics and Gynecology

## 2011-10-21 DIAGNOSIS — M8580 Other specified disorders of bone density and structure, unspecified site: Secondary | ICD-10-CM

## 2011-10-27 ENCOUNTER — Ambulatory Visit (INDEPENDENT_AMBULATORY_CARE_PROVIDER_SITE_OTHER): Payer: MEDICARE

## 2011-10-27 DIAGNOSIS — M8580 Other specified disorders of bone density and structure, unspecified site: Secondary | ICD-10-CM

## 2011-10-27 DIAGNOSIS — M949 Disorder of cartilage, unspecified: Secondary | ICD-10-CM

## 2011-10-28 ENCOUNTER — Ambulatory Visit (INDEPENDENT_AMBULATORY_CARE_PROVIDER_SITE_OTHER): Payer: MEDICARE | Admitting: Obstetrics and Gynecology

## 2011-10-28 ENCOUNTER — Encounter: Payer: Self-pay | Admitting: Obstetrics and Gynecology

## 2011-10-28 VITALS — BP 140/90 | Ht 63.5 in | Wt 167.0 lb

## 2011-10-28 DIAGNOSIS — M8580 Other specified disorders of bone density and structure, unspecified site: Secondary | ICD-10-CM

## 2011-10-28 DIAGNOSIS — D0512 Intraductal carcinoma in situ of left breast: Secondary | ICD-10-CM

## 2011-10-28 DIAGNOSIS — N952 Postmenopausal atrophic vaginitis: Secondary | ICD-10-CM

## 2011-10-28 DIAGNOSIS — D0590 Unspecified type of carcinoma in situ of unspecified breast: Secondary | ICD-10-CM

## 2011-10-28 DIAGNOSIS — M949 Disorder of cartilage, unspecified: Secondary | ICD-10-CM

## 2011-10-28 DIAGNOSIS — Z780 Asymptomatic menopausal state: Secondary | ICD-10-CM

## 2011-10-28 DIAGNOSIS — M899 Disorder of bone, unspecified: Secondary | ICD-10-CM

## 2011-10-28 NOTE — Progress Notes (Signed)
Patient came back to see me today for further followup. She has had ductal carcinoma in situ of the left breast and is status post left mastectomy. She is due for her yearly mammogram of her right breast in July. She initially had a implant placed in the left breast along with a reduction mammoplasty of her right breast but had the implant removed because of  Complications. She requires no adjunctive treatment for her breast cancer. We are watching her bone density with osteopenia. She is on calcium and vitamin D. Yesterday we did followup bone density which showed additional bone loss both in her spine and hip but not an elevated FRAX risk. She has had no fractures. She is using Replens for symptomatic atrophic vaginitis which seems to work well. She is having significant menopausal symptoms. They are no longer waking her up at night and she seems to be tolerating them. She is always had normal Pap smears and had a normal 1 last year. She is status post TAH, left S&O done for fibroids.  ROS: 12 system review done. Pertinent positives above. Only other positive is dry skin.  HEENT: Within normal limits. Blanca present. Neck: No masses. Supraclavicular lymph nodes: Not enlarged. Breasts: Examined in both sitting and lying position. Symmetrical without skin changes or masses. Abdomen: Soft no masses guarding or rebound. No hernias. Pelvic: External within normal limits. BUS within normal limits. Vaginal examination shows poor  estrogen effect, no cystocele enterocele or rectocele. Cervix and uterus absent. Adnexa within normal limits. Rectovaginal confirmatory. Extremities within normal limits.  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 Menopausal symptoms #4. Osteopenia  Plan: Continue yearly mammograms. Continue periodic bone densities. Continue calcium and vitamin D. Offered her SSRI treatment for her hot flashes and she will call if interested. Continue  Replens.

## 2011-11-03 ENCOUNTER — Encounter: Payer: Self-pay | Admitting: Obstetrics and Gynecology

## 2011-11-07 IMAGING — CR DG CHEST 2V
2 series · 2 of 2 positions shown · non-contrast
Comparison: CT and plain films of the chest 11/21/2006.

CLINICAL DATA: Dyspnea.

CHEST - 2 VIEW

[w chest pa]
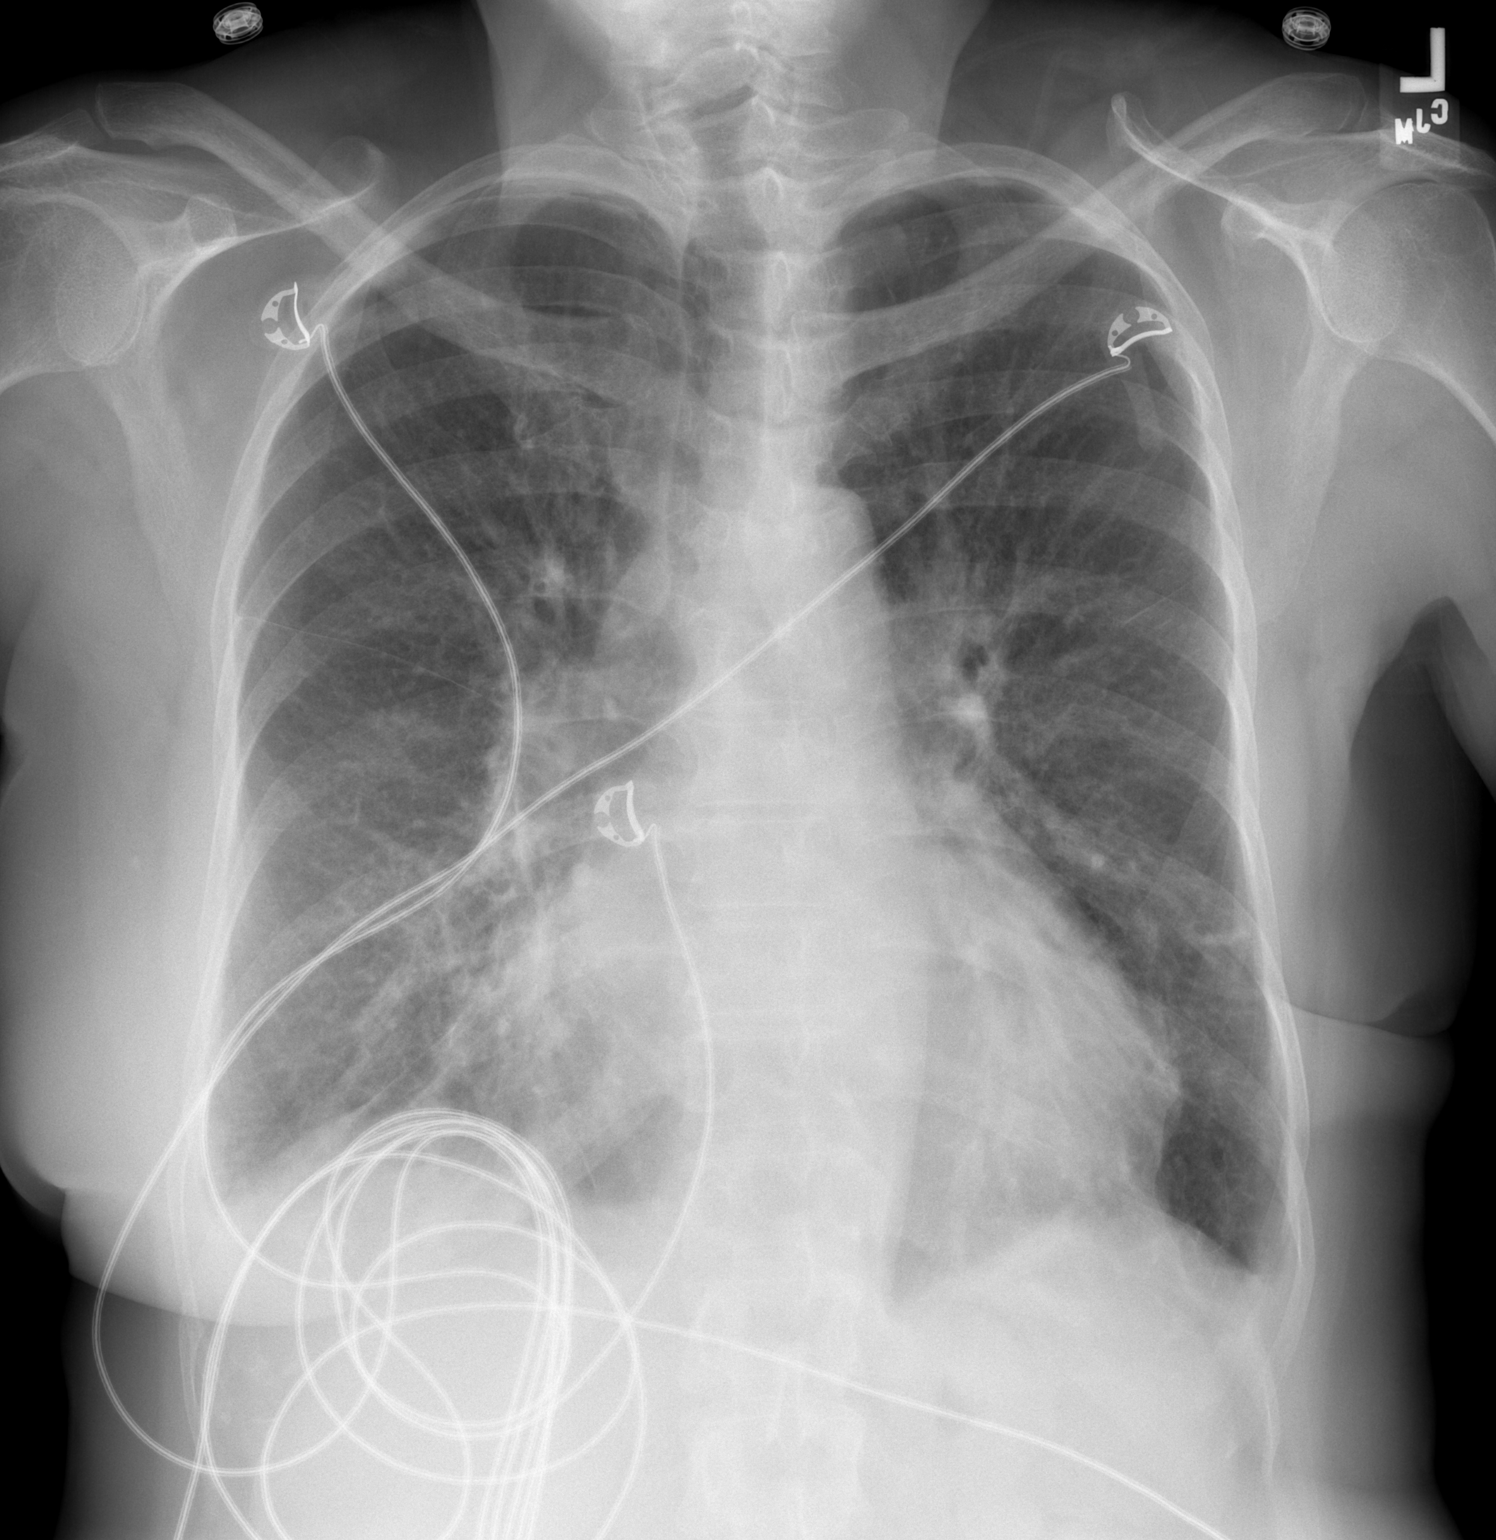

[w chest lat]
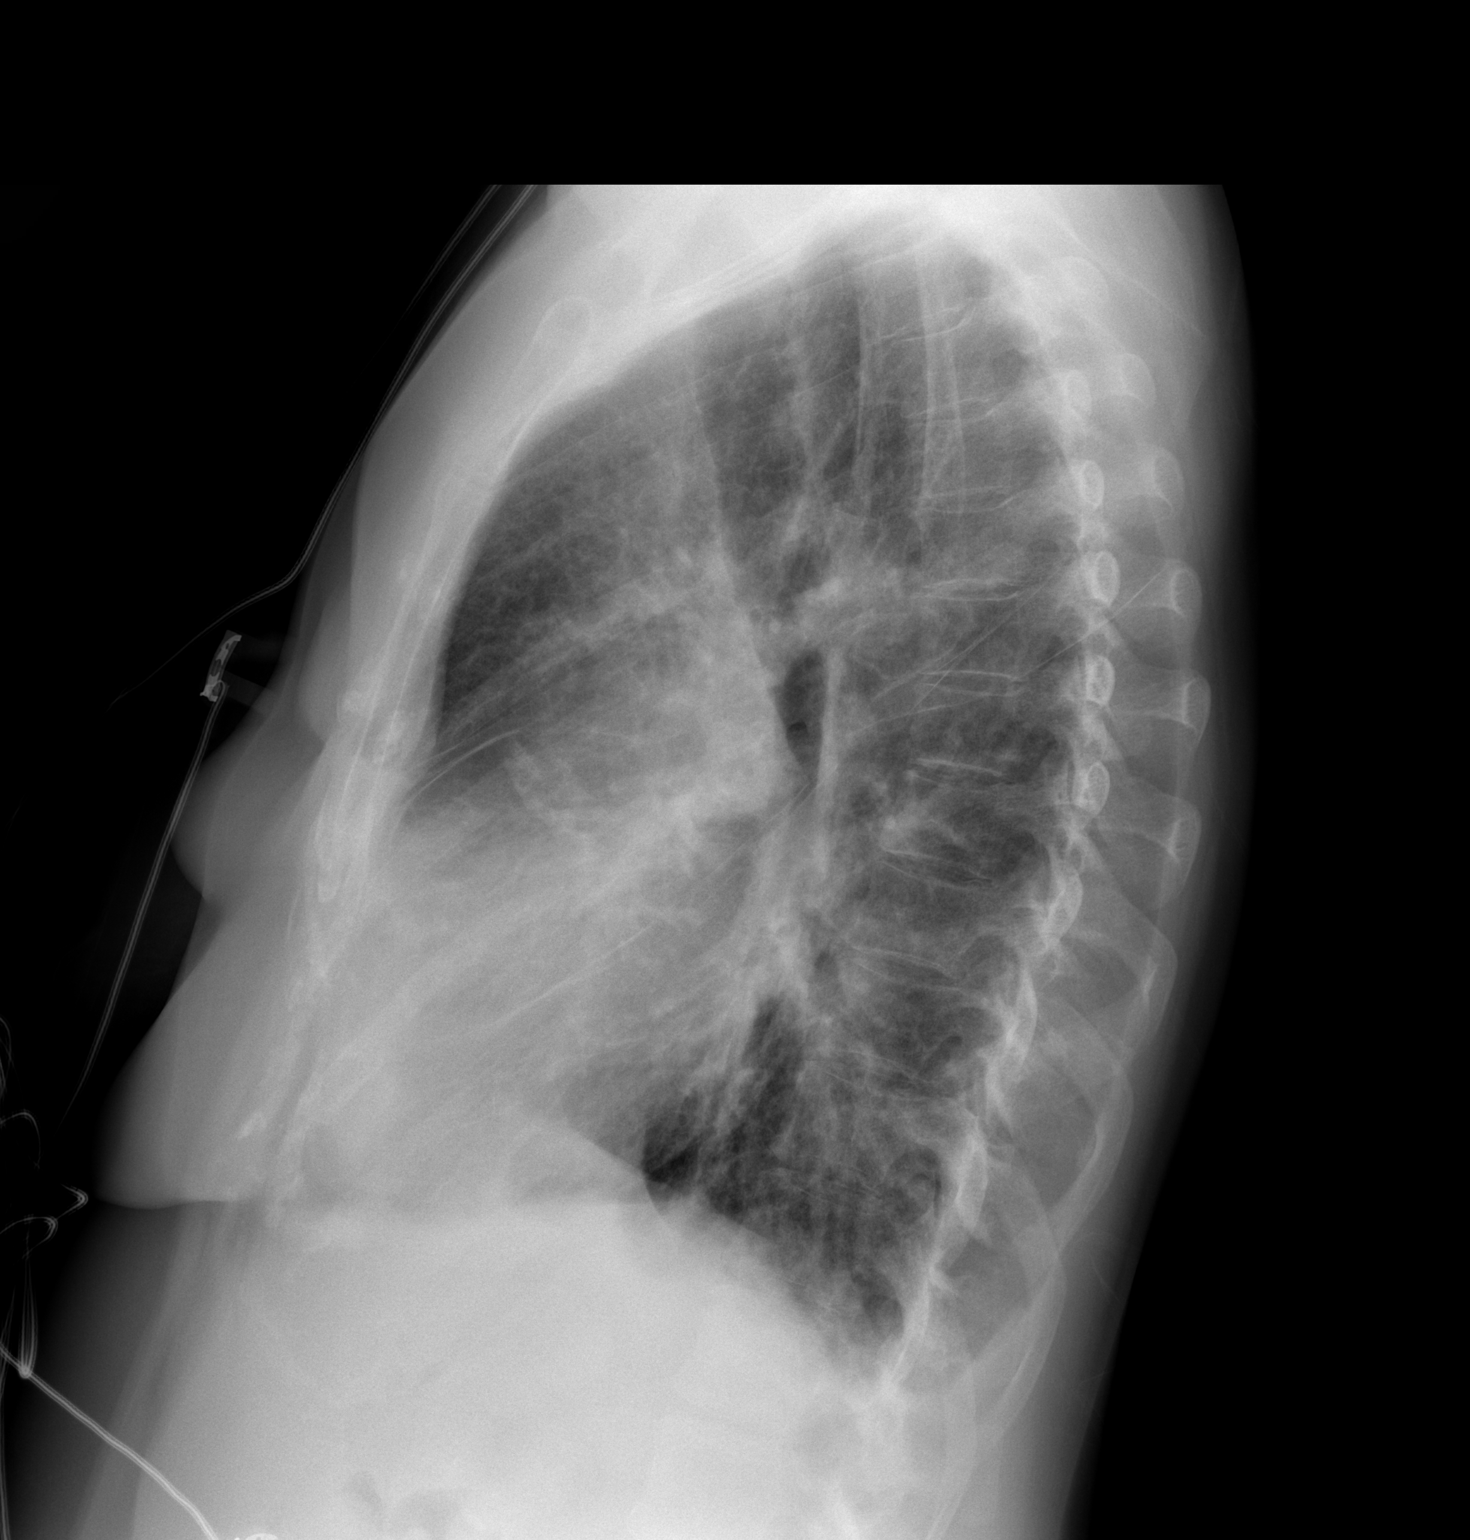

[2 of 2 positions shown; findings below may reference images not displayed]

FINDINGS: The patient is status post left mastectomy.  Patchy
airspace opacity is seen in the right middle lobe.  Small bilateral
pleural effusions are present.  The left lung is clear.
Cardiomegaly and pulmonary vascular congestion without frank edema.
IMPRESSION: 1.  Right middle lobe airspace opacity worrisome for pneumonia.
2.  Cardiomegaly and pulmonary vascular congestion.
3.  Small bilateral pleural effusions, greater on the right.

## 2011-11-09 IMAGING — CR DG CHEST 2V
2 series · 2 of 2 positions shown · non-contrast
Comparison: 02/14/2010

CLINICAL DATA: CHF, shortness of breath, congestion

CHEST - 2 VIEW

[w chest pa]
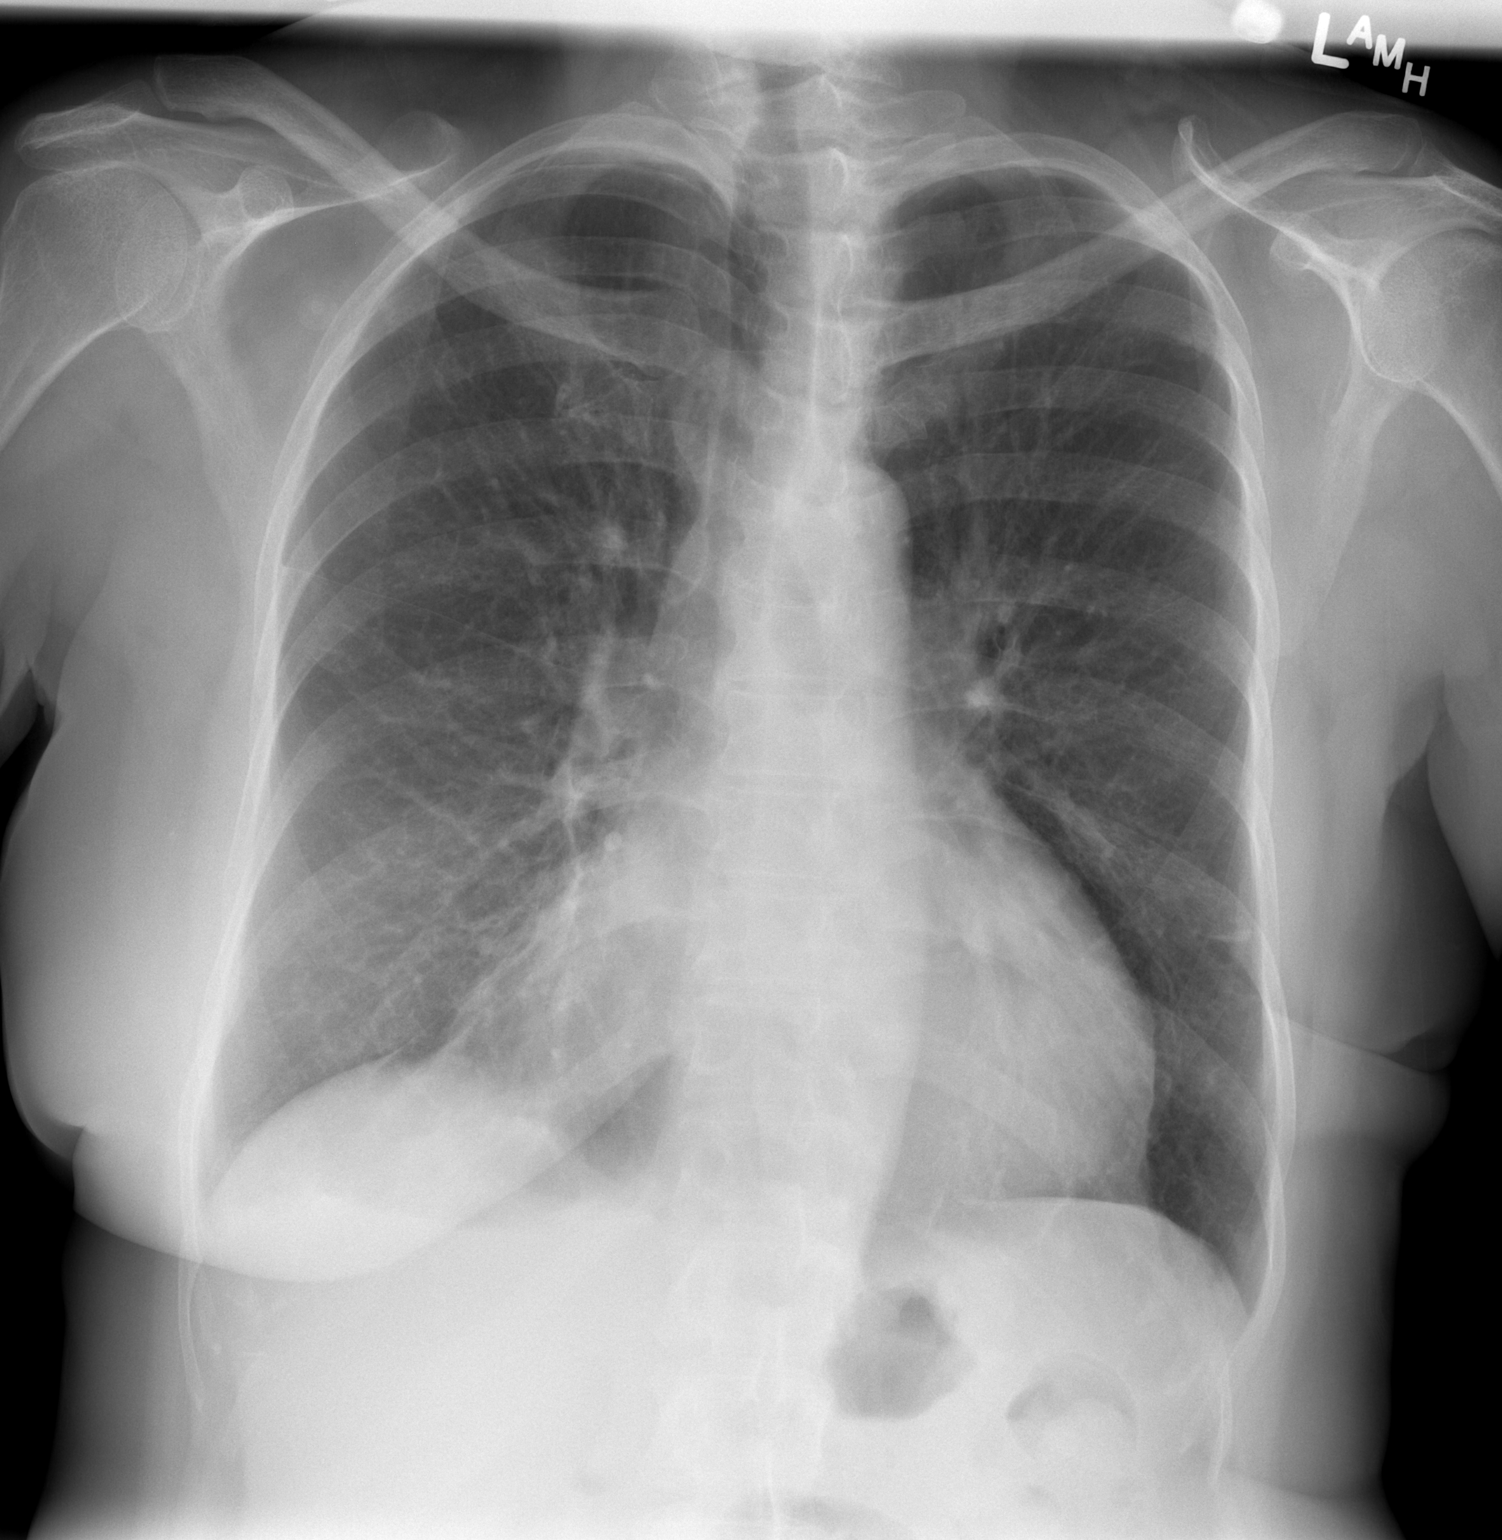

[w chest lat]
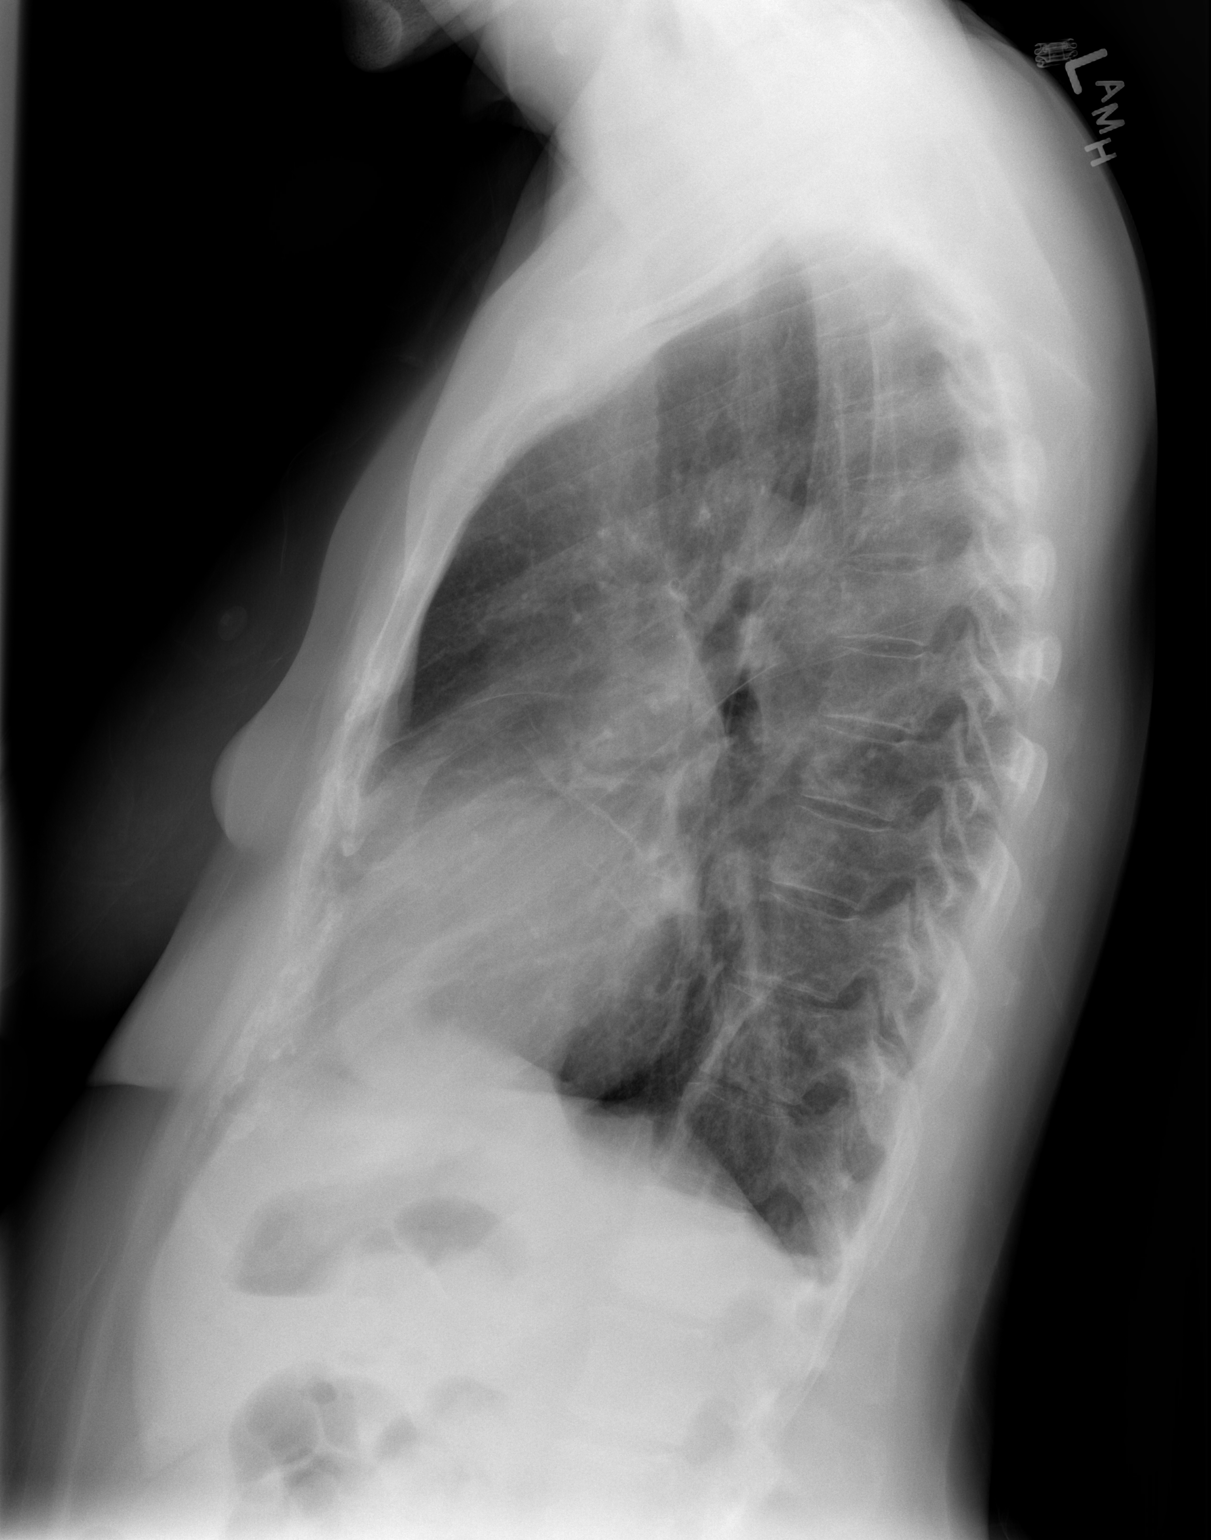

[2 of 2 positions shown; findings below may reference images not displayed]

FINDINGS: Previous left mastectomy noted.  Stable cardiomegaly and
vascular prominence.  Improved aeration of the right middle lobe
with residual streaky opacity suspicious for resolving atelectasis
and/or pneumonia.  Left lung remains clear.  No developing effusion
or pneumothorax.  Midline trachea.
IMPRESSION: Improving right middle lobe atelectasis versus pneumonia.

## 2011-12-10 ENCOUNTER — Other Ambulatory Visit: Payer: Self-pay | Admitting: Obstetrics and Gynecology

## 2011-12-10 ENCOUNTER — Other Ambulatory Visit: Payer: Self-pay | Admitting: *Deleted

## 2011-12-10 DIAGNOSIS — N63 Unspecified lump in unspecified breast: Secondary | ICD-10-CM

## 2012-04-12 ENCOUNTER — Other Ambulatory Visit (HOSPITAL_BASED_OUTPATIENT_CLINIC_OR_DEPARTMENT_OTHER): Payer: MEDICARE | Admitting: Lab

## 2012-04-12 ENCOUNTER — Telehealth: Payer: Self-pay | Admitting: *Deleted

## 2012-04-12 ENCOUNTER — Ambulatory Visit (HOSPITAL_BASED_OUTPATIENT_CLINIC_OR_DEPARTMENT_OTHER): Payer: MEDICARE | Admitting: Oncology

## 2012-04-12 VITALS — BP 160/116 | HR 74 | Temp 98.0°F | Resp 20 | Ht 63.5 in | Wt 164.6 lb

## 2012-04-12 DIAGNOSIS — C50919 Malignant neoplasm of unspecified site of unspecified female breast: Secondary | ICD-10-CM

## 2012-04-12 DIAGNOSIS — Z853 Personal history of malignant neoplasm of breast: Secondary | ICD-10-CM

## 2012-04-12 DIAGNOSIS — E559 Vitamin D deficiency, unspecified: Secondary | ICD-10-CM

## 2012-04-12 LAB — COMPREHENSIVE METABOLIC PANEL (CC13)
ALT: 12 U/L (ref 0–55)
AST: 18 U/L (ref 5–34)
Albumin: 3.8 g/dL (ref 3.5–5.0)
Alkaline Phosphatase: 55 U/L (ref 40–150)
Glucose: 101 mg/dl — ABNORMAL HIGH (ref 70–99)
Potassium: 4.5 mEq/L (ref 3.5–5.1)
Sodium: 142 mEq/L (ref 136–145)
Total Protein: 7.4 g/dL (ref 6.4–8.3)

## 2012-04-12 LAB — CBC WITH DIFFERENTIAL/PLATELET
Basophils Absolute: 0 10*3/uL (ref 0.0–0.1)
EOS%: 5.9 % (ref 0.0–7.0)
HGB: 12.5 g/dL (ref 11.6–15.9)
MCH: 26.7 pg (ref 25.1–34.0)
NEUT#: 3 10*3/uL (ref 1.5–6.5)
RDW: 15.8 % — ABNORMAL HIGH (ref 11.2–14.5)
lymph#: 1.6 10*3/uL (ref 0.9–3.3)

## 2012-04-12 NOTE — Progress Notes (Signed)
Hematology and Oncology Follow Up Visit  Jacqueline Murphy [id number] [birth year] 71 y.o. 04/12/2012 12:19 PM   Principle Diagnosis: 71 yo with er+ T1a breast cancer, s/p surgery 2008, on observation  Interim History:  Doing well, no intercurrent pblms, no recurrence of palpitations. Last mammogram 7/13, small cyst noted . She is on multiple BP meds . No cardiac issues.  Medications: I have reviewed the patient's current medications. Coreg started 10/11  Allergies: No Known Allergies  Past Medical History, Surgical history, Social history, and Family History were reviewed and updated.  Review of Systems: Constitutional:  Negative for fever, chills, night sweats, anorexia, weight loss, pain. Cardiovascular: no chest pain or dyspnea on exertion Respiratory: no cough, shortness of breath, or wheezing Neurological: no TIA or stroke symptoms Dermatological: negative ENT: negative Skin Gastrointestinal: no abdominal pain, change in bowel habits, or black or bloody stools Genito-Urinary: no dysuria, trouble voiding, or hematuria Hematological and Lymphatic: negative Breast: negative for breast lumps Musculoskeletal: negative Remaining ROS negative.  Physical Exam: Blood pressure 160/116, pulse 74, temperature 98 F (36.7 C), resp. rate 20, height 5' 3.5" (1.613 m), weight 164 lb 9.6 oz (74.662 kg). ECOG:  General appearance: alert, cooperative and appears stated age Head: Normocephalic, without obvious abnormality, atraumatic Neck: no adenopathy, no carotid bruit, no JVD, supple, symmetrical, trachea midline and thyroid not enlarged, symmetric, no tenderness/mass/nodules Lymph nodes: Cervical, supraclavicular, and axillary nodes normal. Cardiac : nl Pulmonary:nl Breasts: rt breast normal, lt side s/p mrm, NED; both axilla neg Abdomen:nl Extremities nl Neuro:nl  Lab Results: Lab Results  Component Value Date   WBC 5.4 04/12/2012   HGB 12.5 04/12/2012   HCT 38.2 04/12/2012   MCV 81.8 04/12/2012   PLT 227 04/12/2012     Chemistry      Component Value Date/Time   NA 142 04/12/2012 1025   NA 139 04/10/2011 0947   NA 139 04/10/2011 0947   NA 139 04/10/2011 0947   K 4.5 04/12/2012 1025   K 4.2 04/10/2011 0947   K 4.2 04/10/2011 0947   K 4.2 04/10/2011 0947   CL 107 04/12/2012 1025   CL 104 04/10/2011 0947   CL 104 04/10/2011 0947   CL 104 04/10/2011 0947   CO2 25 04/12/2012 1025   CO2 25 04/10/2011 0947   CO2 25 04/10/2011 0947   CO2 25 04/10/2011 0947   BUN 14.0 04/12/2012 1025   BUN 18 04/10/2011 0947   BUN 18 04/10/2011 0947   BUN 18 04/10/2011 0947   CREATININE 1.1 04/12/2012 1025   CREATININE 1.06 04/10/2011 0947   CREATININE 1.06 04/10/2011 0947   CREATININE 1.06 04/10/2011 0947      Component Value Date/Time   CALCIUM 9.9 04/12/2012 1025   CALCIUM 9.5 04/10/2011 0947   CALCIUM 9.5 04/10/2011 0947   CALCIUM 9.5 04/10/2011 0947   ALKPHOS 55 04/12/2012 1025   ALKPHOS 52 04/10/2011 0947   ALKPHOS 52 04/10/2011 0947   ALKPHOS 52 04/10/2011 0947   AST 18 04/12/2012 1025   AST 21 04/10/2011 0947   AST 21 04/10/2011 0947   AST 21 04/10/2011 0947   ALT 12 04/12/2012 1025   ALT 10 04/10/2011 0947   ALT 10 04/10/2011 0947   ALT 10 04/10/2011 0947   BILITOT 0.51 04/12/2012 1025   BILITOT 0.4 04/10/2011 0947   BILITOT 0.4 04/10/2011 0947   BILITOT 0.4 04/10/2011 0947       Radiological Studies: chest X-ray n/a Mammogram 7/12- nl Bone density 5/11-nl  Impression and Plan: Pt is doing well, w/out evidence of local or distant recurrence; f/u 1 yr, with imaging studies.  More than 50% of the visit was spent in patient-related counselling   Pierce Crane, MD 12/3/201312:19 PM

## 2012-04-12 NOTE — Telephone Encounter (Signed)
Gave patient appointment for 04-2013  

## 2012-07-30 ENCOUNTER — Encounter: Payer: Self-pay | Admitting: *Deleted

## 2012-07-30 ENCOUNTER — Telehealth: Payer: Self-pay | Admitting: *Deleted

## 2012-07-30 NOTE — Telephone Encounter (Signed)
Pt notified of appointment and provider change. Pt agreed with changes. A letter and New schedule will be mailed

## 2012-09-21 ENCOUNTER — Encounter (HOSPITAL_COMMUNITY): Payer: Self-pay | Admitting: Emergency Medicine

## 2012-09-21 ENCOUNTER — Emergency Department (INDEPENDENT_AMBULATORY_CARE_PROVIDER_SITE_OTHER)
Admission: EM | Admit: 2012-09-21 | Discharge: 2012-09-21 | Disposition: A | Discharge status: Home / Self Care | Payer: MEDICARE | Source: Home / Self Care

## 2012-09-21 DIAGNOSIS — Z1839 Other retained organic fragments: Secondary | ICD-10-CM

## 2012-09-21 DIAGNOSIS — S30851A Superficial foreign body of abdominal wall, initial encounter: Secondary | ICD-10-CM

## 2012-09-21 DIAGNOSIS — S2095XA Superficial foreign body of unspecified parts of thorax, initial encounter: Secondary | ICD-10-CM

## 2012-09-21 NOTE — ED Notes (Signed)
Pt c/o poss tick bite on right side hip onset 3 days Sx include: itching and mild pain Denies: f/v/n/d  She is alert and oriented w/no signs of acute distress.

## 2012-09-21 NOTE — ED Provider Notes (Signed)
History     CSN: [id number]  Arrival date & time 09/21/12  1720   First MD Initiated Contact with Patient 09/21/12 1847      Chief Complaint  Patient presents with  . Tick Removal    (Consider location/radiation/quality/duration/timing/severity/associated sxs/prior treatment) HPI Patient presents for removal of a tick on her right flank that has been there for 2-3 days. She has not had a fever or a rash.   Past Medical History  Diagnosis Date  . Fibroid   . Osteopenia   . 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 Abdominal hysterectomy  1990    TAH LSO  . Breast surgery  2008    Mastectomy-Left  . Breast surgery  2008    Reduction-Right  . Tubal ligation    . Oophorectomy      LSO    Family History  Problem Relation Age of Onset  . Lung cancer Mother   . Cancer Mother     LUNG-   . Breast cancer Maternal Aunt   . Diabetes Other   . Cancer Brother     PROSTATE    History  Substance Use Topics  . Smoking status: Former Smoker    Quit date: 10/27/1981  . Smokeless tobacco: Never Used  . Alcohol Use: No    OB History   Grav Para Term Preterm Abortions TAB SAB Ect Mult Living   1 1 1       1       Review of Systems  Constitutional: Negative.   HENT: Negative.   Eyes: Negative.   Respiratory: Negative.   Cardiovascular: Negative.   Gastrointestinal: Negative.   Endocrine: Negative.   Genitourinary: Negative.   Musculoskeletal: Negative.   Skin: Negative.   Neurological: Negative.   Hematological: Negative.   Psychiatric/Behavioral: Negative.     Allergies  Review of patient's allergies indicates no known allergies.  Home Medications   Current Outpatient Rx  Name  Route  Sig  Dispense  Refill  . aspirin 81 MG tablet   Oral   Take 81 mg by mouth daily.         Marland Kitchen atorvastatin (LIPITOR) 10 MG tablet   Oral   Take 10 mg by mouth daily.         . Calcium Carbonate-Vitamin D  (CALCIUM + D PO)   Oral   Take by mouth 2 (two) times daily.          . carvedilol (COREG) 12.5 MG tablet   Oral   Take 12.5 mg by mouth 2 (two) times daily with a meal.         . Cholecalciferol (VITAMIN D PO)   Oral   Take 1,000 Units by mouth daily.          . fexofenadine (ALLEGRA) 180 MG tablet   Oral   Take 180 mg by mouth daily.         . furosemide (LASIX) 40 MG tablet   Oral   Take 40 mg by mouth daily.         . mometasone (NASONEX) 50 MCG/ACT nasal spray   Nasal   Place 2 sprays into the nose as needed.          . Multiple Vitamin (MULTIVITAMIN) tablet   Oral   Take 1 tablet by mouth daily.         . Potassium Chloride (  KLOR-CON PO)   Oral   Take 20 mEq by mouth 2 (two) times daily.          . RABEprazole (ACIPHEX) 20 MG tablet   Oral   Take 20 mg by mouth daily.         . ramipril (ALTACE) 5 MG capsule   Oral   Take 5 mg by mouth daily.           BP 113/65  Pulse 77  Temp(Src) 98.4 F (36.9 C) (Oral)  Resp 16  SpO2 100%  Physical Exam  Constitutional: She appears well-developed and well-nourished.  HENT:  Head: Normocephalic and atraumatic.  Eyes: Conjunctivae are normal. Pupils are equal, round, and reactive to light.  Neck: Normal range of motion. Neck supple.  Cardiovascular: Normal rate and regular rhythm.   Pulmonary/Chest: Effort normal and breath sounds normal.  Abdominal: Soft. Bowel sounds are normal.  Musculoskeletal: Normal range of motion.  Neurological: She is alert.  Skin: Skin is warm and dry.  Clear tick attached to right flank- removed easily with tweezers.   Psychiatric: She has a normal mood and affect. Her behavior is normal.    ED Course  Procedures (including critical care time)  Labs Reviewed - No data to display No results found.   1. Embedded tick of abdominal wall, initial encounter       MDM  Successful removal of tick.         Calvert Cantor, MD 09/21/12 [phone]

## 2012-11-04 ENCOUNTER — Encounter: Payer: Self-pay | Admitting: Gynecology

## 2012-11-10 ENCOUNTER — Telehealth: Payer: Self-pay | Admitting: *Deleted

## 2012-11-10 NOTE — Telephone Encounter (Signed)
Confirmed new appt date and time on 11/18/12 at 1000, labs 0930 with Norina Buzzard.  Calendar and letter sent.

## 2012-11-18 ENCOUNTER — Other Ambulatory Visit (HOSPITAL_BASED_OUTPATIENT_CLINIC_OR_DEPARTMENT_OTHER): Payer: MEDICARE | Admitting: Lab

## 2012-11-18 ENCOUNTER — Encounter: Payer: Self-pay | Admitting: Family

## 2012-11-18 ENCOUNTER — Ambulatory Visit (HOSPITAL_BASED_OUTPATIENT_CLINIC_OR_DEPARTMENT_OTHER): Payer: MEDICARE | Admitting: Family

## 2012-11-18 VITALS — BP 180/79 | HR 75 | Temp 98.4°F | Resp 20 | Ht 63.5 in | Wt 163.2 lb

## 2012-11-18 DIAGNOSIS — C50919 Malignant neoplasm of unspecified site of unspecified female breast: Secondary | ICD-10-CM

## 2012-11-18 DIAGNOSIS — E559 Vitamin D deficiency, unspecified: Secondary | ICD-10-CM

## 2012-11-18 DIAGNOSIS — C50912 Malignant neoplasm of unspecified site of left female breast: Secondary | ICD-10-CM

## 2012-11-18 LAB — COMPREHENSIVE METABOLIC PANEL (CC13)
ALT: 13 U/L (ref 0–55)
AST: 19 U/L (ref 5–34)
CO2: 25 mEq/L (ref 22–29)
Calcium: 9.7 mg/dL (ref 8.4–10.4)
Chloride: 107 mEq/L (ref 98–109)
Creatinine: 1.1 mg/dL (ref 0.6–1.1)
Sodium: 142 mEq/L (ref 136–145)
Total Protein: 7.5 g/dL (ref 6.4–8.3)

## 2012-11-18 LAB — CBC WITH DIFFERENTIAL/PLATELET
BASO%: 0.7 % (ref 0.0–2.0)
Eosinophils Absolute: 0.5 10*3/uL (ref 0.0–0.5)
MCHC: 33.3 g/dL (ref 31.5–36.0)
MONO#: 0.5 10*3/uL (ref 0.1–0.9)
NEUT#: 3.1 10*3/uL (ref 1.5–6.5)
RBC: 4.45 10*6/uL (ref 3.70–5.45)
RDW: 15.9 % — ABNORMAL HIGH (ref 11.2–14.5)
WBC: 5.8 10*3/uL (ref 3.9–10.3)
lymph#: 1.8 10*3/uL (ref 0.9–3.3)

## 2012-11-18 NOTE — Patient Instructions (Addendum)
Please contact us at [phone] if you have any questions or concerns.  Please continue to do well and enjoy life!!!  Get plenty of rest, drink plenty of water, exercise daily (walking), eat a balanced diet.  Continue to take calcium  and vitamin D3 1000 IUs daily.   Complete monthly self-breast examinations.  Have a clinical breast exam by a physician every year.  Have your mammogram completed every year.  For dryness try coconut oil, Astroglide, continue Replens.  Dr. Lily Peer and Dr. Audie Box at: [facility] [address] [place], [place] [address] Main: [phone]   Results for orders placed in visit on 11/18/12 (from the past 24 hour(s))  CBC WITH DIFFERENTIAL     Status: Abnormal   Collection Time    11/18/12  9:44 AM      Result Value Range   WBC 5.8  3.9 - 10.3 10e3/uL   NEUT# 3.1  1.5 - 6.5 10e3/uL   HGB 12.0  11.6 - 15.9 g/dL   HCT 91.4  78.2 - 95.6 %   Platelets 235  145 - 400 10e3/uL   MCV 80.9  79.5 - 101.0 fL   MCH 26.9  25.1 - 34.0 pg   MCHC 33.3  31.5 - 36.0 g/dL   RBC 2.13  0.86 - 5.78 10e6/uL   RDW 15.9 (*) 11.2 - 14.5 %   lymph# 1.8  0.9 - 3.3 10e3/uL   MONO# 0.5  0.1 - 0.9 10e3/uL   Eosinophils Absolute 0.5  0.0 - 0.5 10e3/uL   Basophils Absolute 0.0  0.0 - 0.1 10e3/uL   NEUT% 52.7  38.4 - 76.8 %   LYMPH% 30.4  14.0 - 49.7 %   MONO% 8.4  0.0 - 14.0 %   EOS% 7.8 (*) 0.0 - 7.0 %   BASO% 0.7  0.0 - 2.0 %   Narrative:    Performed At:  [facility]               [address]               [place], [place] [address]  COMPREHENSIVE METABOLIC PANEL (CC13)     Status: None   Collection Time    11/18/12  9:44 AM      Result Value Range   Sodium 142  136 - 145 mEq/L   Potassium 4.3  3.5 - 5.1 mEq/L   Chloride 107  98 - 109 mEq/L   CO2 25  22 - 29 mEq/L   Glucose 89  70 - 140 mg/dl   BUN 95.2  7.0 - 84.1 mg/dL   Creatinine 1.1  0.6 - 1.1 mg/dL   Total Bilirubin 3.24  0.20 - 1.20 mg/dL   Alkaline  Phosphatase 56  40 - 150 U/L   AST 19  5 - 34 U/L   ALT 13  0 - 55 U/L   Total Protein 7.5  6.4 - 8.3 g/dL   Albumin 3.5  3.5 - 5.0 g/dL   Calcium 9.7  8.4 - 40.1 mg/dL   Narrative:    Note: New Reference ranges.Performed At:  [facility]               [address]               [place], [place] [address]

## 2012-11-18 NOTE — Progress Notes (Signed)
[facility]  [phone] [phone]  OFFICE PROGRESS NOTE   ID: Jacqueline Murphy   DOB: June 27, 1940  MR#: [id number]  JYN#:[id number]   PCP: Jacqueline Apo, MD GYN: Jacqueline Coombes. Fontaine, MD SU:  Jacqueline Guthrie. Shon Hough, MD SU: Jacqueline Paris, MD   HISTORY OF PRESENT ILLNESS: From Jacqueline Murphy's new patient evaluation note dated 12/15/2006: "This is a pleasant 72 year old woman from [facility] referred by Jacqueline Murphy for evaluation and treatment of breast cancer. Jacqueline Murphy has been in relatively good health.  She got screening mammography on an annual basis.  She had initial mammogram done on July 09, 2006.  This showed a left breast nodule.  Further views were recommended.  Right breast mammogram and ultrasound was done on 07/21/06.  This showed hyperechoic mass 12 o'clock position of the left breast.  Fine needle biopsy was recommended.  Biopsy of the left breast on 08/10/06 showed invasive ductal cancer.  This appeared to be intermediate grade, ER-PR negative, HER-2 was 3+, proliferative index 32%.  A followup preoperative MRI scan was done on 08/27/06.  This essentially showed a nodule at biopsy site in addition to a stippled area of early enhancement upper quadrant midline, findings suspicious for multicentric cancer.  Subsequent MRI directed biopsy was done on 09/16/06, which showed DCIS.  Because of the extensive nature of the malignancy, it was felt that she would benefit from mastectomy.  She did, in fact, undergo a mastectomy with reconstruction on 11/19/06.  Sentinel node biopsy was also performed.  The left simple mastectomy showed high grade DCIS with necrosis.  No residual invasive cancer was seen.  Second biopsy with fibrosis benign breast parenchyma was seen.  A reduction mammoplasty done on the right side showed no obvious abnormalities.  The actual component was estimated to be 0.5 cm based on the previous needle biopsy report.  This was grade 2  of 3 and ER-PR negative, HER-2 was 3+, proliferative index was 32%.  Jacqueline Murphy has had a unremarkable postoperative course."  Her subsequent history is as detailed below.  INTERVAL HISTORY: Dr. Darnelle Murphy and I saw 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  The patient was last seen by Dr. Donnie Murphy on 04/12/2012.  Since her last office visit, the patient has been doing relatively well.  She is establishing herself with Jacqueline Murphy service today.   REVIEW OF SYSTEMS: A 10 point review of systems was completed and is negative except flashes and vaginal dryness.  The patient states she uses Replens for vaginal dryness which is effective.  The patient denies any other symptomatology.   PAST MEDICAL HISTORY: Past Medical History  Diagnosis Date  . Fibroid   . Osteopenia   . 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 Hypertension   . GERD (gastroesophageal reflux disease)   . CHF (congestive heart failure) 2011    Left heart failure  She has a history of GERD with history of seasonal allergies and previous history of pneumonia in 2005.     PAST SURGICAL HISTORY: Past Surgical History  Procedure Laterality Date  . Abdominal hysterectomy  1990    TAH LSO  . Breast surgery  2008    Mastectomy-Left  . Breast surgery  2008    Reduction-Right  . Tubal ligation    . Oophorectomy      LSO  Hysterectomy 1990 with one ovary  left.  She has had previous bilateral breast biopsies in 1991.   FAMILY HISTORY Family History  Problem Relation Age of Onset  . Lung cancer Mother   . Cancer Mother     LUNG-   . Breast cancer Maternal Aunt   . Diabetes Other   . Cancer Brother     PROSTATE  . Heart Problems Father   Parents both deceased.  Father died of heart problems.  Mother died of lung tumor metastatic to brain.  One brother is alive and well with history of prostate cancer.  She has  a maternal aunt with breast cancer diagnosed at age 36.   GYNECOLOGIC HISTORY: Gravida 1, para 1, menarche age 43, parity age 7,  menopause at time of hysterectomy in 1990, was on Premarin until the time of her diagnosis in 2008.  SOCIAL HISTORY: Jacqueline Murphy and her husband Jacqueline Murphy have been married since 27.  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  Her husband retired from a roadway as a Naval architect in [address]10.  The patient and her husband have one adult son that lives in [place], 1 grandchild, and one great-grandchild.  In her spare time [name] enjoys cooking, sewing, [employer], and traveling.   ADVANCED DIRECTIVES: In place  HEALTH MAINTENANCE: History  Substance Use Topics  . Smoking status: Former Smoker    Quit date: 10/27/1981  . Smokeless tobacco: Never Used  . Alcohol Use: No    Colonoscopy: Not on file  PAP: 10/27/2010 Bone density:  The patient's last bone density scan on 10/27/2011 showed osteopenia without a specific T score provided. Lipid panel: Not on file  No Known Allergies  Current Outpatient Prescriptions  Medication Sig Dispense Refill  . aspirin 81 MG tablet Take 81 mg by mouth daily.      Marland Kitchen atorvastatin (LIPITOR) 10 MG tablet Take 10 mg by mouth daily.      . Calcium Carbonate-Vitamin D (CALCIUM + D PO) Take by mouth 2 (two) times daily.       . carvedilol (COREG) 12.5 MG tablet Take 12.5 mg by mouth 2 (two) times daily with a meal.      . Cholecalciferol (VITAMIN D PO) Take 1,000 Units by mouth daily.       . fexofenadine (ALLEGRA) 180 MG tablet Take 180 mg by mouth as needed.       . furosemide (LASIX) 40 MG tablet Take 20 mg by mouth daily.       Marland Kitchen latanoprost (XALATAN) 0.005 % ophthalmic solution Place 1 drop into the right eye at bedtime.      . mometasone (NASONEX) 50 MCG/ACT nasal spray Place 2 sprays into the nose as needed.       . Multiple Vitamin (MULTIVITAMIN) tablet Take 1 tablet by mouth  daily.      . Potassium Chloride (KLOR-CON PO) Take 20 mEq by mouth 2 (two) times daily.       . RABEprazole (ACIPHEX) 20 MG tablet Take 20 mg by mouth daily.      . ramipril (ALTACE) 5 MG capsule Take 5 mg by mouth at bedtime.        No current facility-administered medications for this visit.    OBJECTIVE: Filed Vitals:   11/18/12 0959  BP: 180/79  Pulse: 75  Temp: 98.4 F (36.9 C)  Resp: 20     Body mass index is 28.45 kg/(m^2).      ECOG FS: 1 -  Symptomatic but completely ambulatory  General appearance: Alert, cooperative, well nourished, no apparent distress Head: Normocephalic, without obvious abnormality, atraumatic, dentures Eyes: Arcus senilis, PERRLA, EOMI Nose: Nares, septum and mucosa are normal, no drainage or sinus tenderness Neck: No adenopathy, supple, symmetrical, trachea midline, no tenderness Resp: Clear to auscultation bilaterally Cardio: Regular rate and rhythm, S1, S2 normal, no murmur, click, rub or gallop Breasts: Left breast is surgically absent, left chest wall area has well-healed surgical scars, no lymphadenopathy, right breast does not have any nipple inversion, bilateral axillary fullness GI: Soft, distended, non-tender, hypoactive bowel sounds, no organomegaly Extremities: Extremities normal, atraumatic, no cyanosis or edema/lymphedema Lymph nodes: Cervical, supraclavicular, and axillary nodes normal Neurologic: Grossly normal    LAB RESULTS: Lab Results  Component Value Date   WBC 5.8 11/18/2012   NEUTROABS 3.1 11/18/2012   HGB 12.0 11/18/2012   HCT 36.0 11/18/2012   MCV 80.9 11/18/2012   PLT 235 11/18/2012      Chemistry      Component Value Date/Time   NA 142 11/18/2012 0944   NA 139 04/10/2011 0947   K 4.3 11/18/2012 0944   K 4.2 04/10/2011 0947   CL 107 04/12/2012 1025   CL 104 04/10/2011 0947   CO2 25 11/18/2012 0944   CO2 25 04/10/2011 0947   BUN 14.0 11/18/2012 0944   BUN 18 04/10/2011 0947   CREATININE 1.1 11/18/2012 0944    CREATININE 1.06 04/10/2011 0947      Component Value Date/Time   CALCIUM 9.7 11/18/2012 0944   CALCIUM 9.5 04/10/2011 0947   ALKPHOS 56 11/18/2012 0944   ALKPHOS 52 04/10/2011 0947   AST 19 11/18/2012 0944   AST 21 04/10/2011 0947   ALT 13 11/18/2012 0944   ALT 10 04/10/2011 0947   BILITOT 0.36 11/18/2012 0944   BILITOT 0.4 04/10/2011 0947      Lab Results  Component Value Date   LABCA2 13 04/10/2011    Urinalysis    Component Value Date/Time   COLORURINE STRAW* 11/16/2006 0902   APPEARANCEUR CLEAR 11/16/2006 0902   LABSPEC 1.006 11/16/2006 0902   PHURINE 6.5 11/16/2006 0902   GLUCOSEU NEGATIVE 11/16/2006 0902   HGBUR NEGATIVE 11/16/2006 0902   BILIRUBINUR NEGATIVE 11/16/2006 0902   KETONESUR NEGATIVE 11/16/2006 0902   PROTEINUR NEGATIVE 11/16/2006 0902   UROBILINOGEN 0.2 11/16/2006 0902   NITRITE NEGATIVE 11/16/2006 0902   LEUKOCYTESUR TRACE* 11/16/2006 0902    STUDIES: 1. Her last diagnostic unilateral right mammogram with right breast ultrasound on 12/09/2011 showed there is persistence of 6 mm rounded partially well circumcised mass.   In the retroareolar area of the right breast there is a 5 mm simple cyst noted benign findings.  Return to screening yearly mammograms recommended.  2.  The patient's last bone density scan on 10/27/2011 showed osteopenia without a specific T score provided.   ASSESSMENT: 72 y.o. [place], [place]: 1.  Left breast needle core biopsy at the 12 o'clock position on 08/10/2006 which showed invasive mammary carcinoma that appeared to be an intermediate grade invasive ductal carcinoma, estrogen receptor negative, progesterone receptor negative, Ki-67 32%, HER-2/neu positive 3+.  2.  The patient had bilateral breast MRI on 08/27/2006 which showed in the right breast no abnormal enhancement was identified.  No aggressive appearing lymphadenopathy.  No abnormalities chest wall or structures deep to the chest wall.  No abnormal fluid collections,  lymphoceles, or seromas.  In the left breast there was a 0.7 cm hypervascular nodule at  the biopsy site.  A second area of stippled, early enhancement was identified in the upper quadrant midline anterior and inferior to the biopsy site with areas of linear enhancement suspicious for tendinosis.  Biopsy was recommended to exclude multicentric carcinoma.  No aggressive appearing lymphadenopathy.  The chest wall and structures deep to the chest wall were without abnormality.  No abnormal fluid collections, lymphoceles, or seromas.  There were no pleural effusions.  3.  Status post left breast needle core biopsy on 09/16/2006 which showed ductal carcinoma in situ high-grade.  4.  Status post left breast simple mastectomy with a left axillary node sentinel biopsy with right breast reduction mammoplasty on 11/19/2006 for a stage 0, pTis pN0 (i-), 0.5 cm high-grade ductal carcinoma in situ with necrosis at the 12 o'clock position and no residual invasive ductal carcinoma identified second biopsy site with fibrosis and benign breast parenchyma nipple free of Paget's disease seborrheic keratoses in the right breast reduction mammoplasty showed benign breast parenchyma no atypical ductal hyperplasia or malignancy identified, estrogen receptor negative, progesterone receptor negative, Ki-67 32, HER-2/neu 3+ positive, with 0/1 metastatic left axillary lymph nodes.  Expander placement at the time of surgery.  5.  The patient had complications with her expander and it was subsequently removed on 12/06/2007.  6.  She did not receive Herceptin in the adjuvant setting.   7.  Hot flashes/vaginal dryness   PLAN: Mrs. Hopfer is nearing 6 years from her time of diagnosis and will officially become a graduate of [employer]'s breast cancer program today.  We asked that she continue annual clinical breast examinations by a physician in addition to annual mammography.  Her last mammogram results are listed above.  She states she  has her annual mammogram scheduled for 12/2012.  We discussed various over-the-counter remedies for vaginal dryness and she will continue to use Replens.  All questions were answered.  The patient was encouraged to contact us with any problems, questions or concerns.   Larina Bras, NP-C 11/18/2012, 1:36 PM  ADDENDUM: This 72 year old [place] woman under 1 left mastectomy with sentinel lymph node sampling 11/19/2006 for a pT1s pN0 ductal carcinoma in situ, associated with a microinvasive ductal carcinoma, grade 2, which was estrogen and progesterone receptor negative, HER-2 positive, with an MIB-1 of 32%.  I discussed the patient's diagnosis, treatment history and prognosis with her in detail today. She understands that in noninvasive breast cancer is essentially cured (99% of the time) with mastectomy. Accordingly she has a ready deltoid with the noninvasive portion of her breast cancer  The invasive disease was 5 mm and noted only in the original biopsy. Breast cancers that are the small rarely recur after local treatment only, and therefore I concur with Dr. Renelda Loma original decision to not treat the patient with trastuzumab or chemotherapy. Ms. Kulik risk of this tumor recurrent at this point is well under 5%.  Accordingly I am comfortable releasing her back to her primary care physician. She understands she will continue to need yearly mammography on the right and yearly physician breast exam. We will be glad to see her in the future if the need arises, but as of now we are making no further appointments for her here.   I personally saw this patient and performed a substantive portion of this encounter with the listed APP documented above.   Lowella Dell, MD

## 2012-11-19 LAB — VITAMIN D 25 HYDROXY (VIT D DEFICIENCY, FRACTURES): Vit D, 25-Hydroxy: 62 ng/mL (ref 30–89)

## 2012-11-21 DIAGNOSIS — C50912 Malignant neoplasm of unspecified site of left female breast: Secondary | ICD-10-CM | POA: Insufficient documentation

## 2013-04-12 ENCOUNTER — Other Ambulatory Visit: Payer: MEDICARE | Admitting: Lab

## 2013-04-12 ENCOUNTER — Ambulatory Visit: Payer: MEDICARE | Admitting: Family

## 2013-04-12 ENCOUNTER — Ambulatory Visit: Payer: MEDICARE | Admitting: Oncology

## 2013-08-02 ENCOUNTER — Ambulatory Visit: Payer: Medicare HMO | Admitting: Interventional Cardiology

## 2013-08-24 ENCOUNTER — Encounter: Payer: Self-pay | Admitting: Interventional Cardiology

## 2013-08-24 ENCOUNTER — Ambulatory Visit (INDEPENDENT_AMBULATORY_CARE_PROVIDER_SITE_OTHER): Payer: MEDICARE | Admitting: Interventional Cardiology

## 2013-08-24 VITALS — BP 150/80 | HR 70 | Ht 64.5 in | Wt 163.0 lb

## 2013-08-24 DIAGNOSIS — I10 Essential (primary) hypertension: Secondary | ICD-10-CM

## 2013-08-24 DIAGNOSIS — E782 Mixed hyperlipidemia: Secondary | ICD-10-CM | POA: Insufficient documentation

## 2013-08-24 DIAGNOSIS — I428 Other cardiomyopathies: Secondary | ICD-10-CM

## 2013-08-24 DIAGNOSIS — I447 Left bundle-branch block, unspecified: Secondary | ICD-10-CM | POA: Insufficient documentation

## 2013-08-24 MED ORDER — FUROSEMIDE 40 MG PO TABS: 20.0000 mg | ORAL_TABLET | Freq: Every day | ORAL | Status: DC

## 2013-08-24 MED ORDER — POTASSIUM CHLORIDE CRYS ER 20 MEQ PO TBCR: 20.0000 meq | EXTENDED_RELEASE_TABLET | Freq: Two times a day (BID) | ORAL | Status: DC

## 2013-08-24 MED ORDER — RAMIPRIL 5 MG PO CAPS: 5.0000 mg | ORAL_CAPSULE | Freq: Every day | ORAL | Status: DC

## 2013-08-24 MED ORDER — CARVEDILOL 12.5 MG PO TABS: 12.5000 mg | ORAL_TABLET | Freq: Two times a day (BID) | ORAL | Status: DC

## 2013-08-24 MED ORDER — ATORVASTATIN CALCIUM 10 MG PO TABS: 10.0000 mg | ORAL_TABLET | Freq: Every day | ORAL | Status: DC

## 2013-08-24 NOTE — Patient Instructions (Signed)
Your physician recommends that you continue on your current medications as directed. Please refer to the Current Medication list given to you today.  Your physician wants you to follow-up in: 1 year with Dr. Varanasi. You will receive a reminder letter in the mail two months in advance. If you don't receive a letter, please call our office to schedule the follow-up appointment.  

## 2013-08-24 NOTE — Progress Notes (Signed)
Patient ID: Jacqueline Murphy, female   DOB: 07-Sep-1940, 73 y.o.   MRN: [id number]    Jacqueline Murphy, [address], [place]  [address] Phone: [phone] Fax:  [phone]  Date:  08/24/2013   ID:  Jacqueline Murphy, DOB 21-Jan-1941, MRN [id number]  PCP:  Kandice Hams, MD      History of Present Illness: Jacqueline Murphy is a 73 y.o. female who had a cardiomyopathy. It was likely nonischemic. Stress test was normal. Echo 6 months ago showed improved LV function. She hurt her back. SHe is back to walking. SHe has no problems with this. She has some joint pains. BP in the morning 573 systolic. In the evening, it is 220-254 systolic. Cardiomyopathy:  Denies : Chest pain.  Shortness of breath.  Dizziness.  Leg edema.  Orthopnea.  Paroxysmal nocturnal dyspnea.  Palpitations.  Syncope.  Has had allergies. She sleeps well at night.    Wt Readings from Last 3 Encounters:  08/24/13 163 lb (73.936 kg)  11/18/12 163 lb 3.2 oz (74.027 kg)  04/12/12 164 lb 9.6 oz (74.662 kg)     Past Medical History  Diagnosis Date  . Fibroid   . Osteopenia   . 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 Hypertension   . GERD (gastroesophageal reflux disease)   . CHF (congestive heart failure) 2011    Left heart failure    Current Outpatient Prescriptions  Medication Sig Dispense Refill  . aspirin 81 MG tablet Take 81 mg by mouth daily.      Marland Kitchen atorvastatin (LIPITOR) 10 MG tablet Take 10 mg by mouth daily.      . Calcium Carbonate-Vitamin D (CALCIUM + D PO) Take by mouth 2 (two) times daily.       . carvedilol (COREG) 12.5 MG tablet Take 12.5 mg by mouth 2 (two) times daily with a meal.      . Cholecalciferol (VITAMIN D PO) Take 1,000 Units by mouth daily.       . fexofenadine (ALLEGRA) 180 MG tablet Take 180 mg by mouth as needed.       . furosemide (LASIX) 40 MG tablet Take 20 mg by mouth daily.       Marland Kitchen latanoprost (XALATAN) 0.005 % ophthalmic solution Place 1 drop into  the right eye at bedtime.      . mometasone (NASONEX) 50 MCG/ACT nasal spray Place 2 sprays into the nose as needed.       . Multiple Vitamin (MULTIVITAMIN) tablet Take 1 tablet by mouth daily.      . Potassium Chloride (KLOR-CON PO) Take 20 mEq by mouth 2 (two) times daily.       . RABEprazole (ACIPHEX) 20 MG tablet Take 20 mg by mouth daily.      . ramipril (ALTACE) 5 MG capsule Take 5 mg by mouth at bedtime.        No current facility-administered medications for this visit.    Allergies:   No Known Allergies  Social History:  The patient  reports that she quit smoking about 31 years ago. She has never used smokeless tobacco. She reports that she does not drink alcohol or use illicit drugs.   Family History:  The patient's family history includes Breast cancer in her maternal aunt; Cancer in her brother and mother; Diabetes in her other; Heart Problems in her father; Lung cancer in her mother.   ROS:  Please see the history of  present illness.  No nausea, vomiting.  No fevers, chills.  No focal weakness.  No dysuria.    All other systems reviewed and negative.   PHYSICAL EXAM: VS:  BP 150/80  Pulse 70  Ht 5' 4.5" (1.638 m)  Wt 163 lb (73.936 kg)  BMI 27.56 kg/m2 Well nourished, well developed, in no acute distress HEENT: normal Neck: no JVD, no carotid bruits Cardiac:  normal S1, S2; RRR;  Lungs:  clear to auscultation bilaterally, no wheezing, rhonchi or rales Abd: soft, nontender, no hepatomegaly Ext: no edema Skin: warm and dry Neuro:   no focal abnormalities noted  EKG:  NSR LBBB     ASSESSMENT AND PLAN:  Cardiomyopathy  Continue Lasix Tablet, 40 MG, half tablet, Orally, Once a day No sx.  Following low salt diet.  Walking regularly.     Notes: No signs of CHF. Negative nuclear study in 2011.= Labs stable in 9/12. Rarely takes 40 mg daily. Last EF 40%.  2. Essential hypertension, benign  Continue Ramipril Capsule, 5 MG, 1 capsule, Orally, Once a day Continue  Carvedilol Tablet, 6.25 MG, 1 tablet with food, Orally, Twice a day Notes: Controlled. Normal readings at home. COuld increase ramipiril if needed. If Higher in the morning.,  Trying ramipril at night and see if AM readings are better.  3. LBBB  IMAGING: EKG   Harward,Amy 07/29/2012 02:27:39 PM > Jmichael Gille,JAY 07/29/2012 02:42:42 PM > NSR, LBBB  4. Significant reduction in LDL.  LDL 65 in 4/14, down from 148. Continue atorvastatin.  Notes: Consider BiV pacing if she has sx of CHF. She will see how increased exercise goes. We discussed improved timing with biV pacing.    Signed, Mina Marble, MD, [facility] 08/24/2013 3:35 PM

## 2014-03-12 ENCOUNTER — Encounter: Payer: Self-pay | Admitting: Interventional Cardiology

## 2014-07-31 ENCOUNTER — Other Ambulatory Visit: Payer: Self-pay | Admitting: Interventional Cardiology

## 2014-08-17 ENCOUNTER — Other Ambulatory Visit: Payer: Self-pay | Admitting: Interventional Cardiology

## 2014-08-29 ENCOUNTER — Other Ambulatory Visit: Payer: Self-pay | Admitting: Interventional Cardiology

## 2014-09-26 ENCOUNTER — Encounter: Payer: Self-pay | Admitting: Interventional Cardiology

## 2014-09-26 ENCOUNTER — Ambulatory Visit (INDEPENDENT_AMBULATORY_CARE_PROVIDER_SITE_OTHER): Payer: MEDICARE | Admitting: Interventional Cardiology

## 2014-09-26 VITALS — BP 130/72 | HR 77 | Ht 65.0 in | Wt 168.8 lb

## 2014-09-26 DIAGNOSIS — E782 Mixed hyperlipidemia: Secondary | ICD-10-CM

## 2014-09-26 DIAGNOSIS — I428 Other cardiomyopathies: Secondary | ICD-10-CM

## 2014-09-26 DIAGNOSIS — I447 Left bundle-branch block, unspecified: Secondary | ICD-10-CM

## 2014-09-26 DIAGNOSIS — I429 Cardiomyopathy, unspecified: Secondary | ICD-10-CM | POA: Diagnosis not present

## 2014-09-26 DIAGNOSIS — I10 Essential (primary) hypertension: Secondary | ICD-10-CM | POA: Diagnosis not present

## 2014-09-26 NOTE — Patient Instructions (Signed)
Medication Instructions:  Your physician recommends that you continue on your current medications as directed. Please refer to the Current Medication list given to you today.  Labwork: We will request a copy of your lab work from Dr. Lina Sar office.   Testing/Procedures: NONE  Follow-Up: Your physician wants you to follow-up in: 12 months with Dr. Irish Lack. You will receive a reminder letter in the mail two months in advance. If you don't receive a letter, please call our office to schedule the follow-up appointment.   Any Other Special Instructions Will Be Listed Below (If Applicable).

## 2014-09-26 NOTE — Progress Notes (Signed)
Patient ID: Jacqueline Murphy, female   DOB: Dec 14, 1940, 74 y.o.   MRN: [id number]     Cardiology Office Note   Date:  09/26/2014   ID:  LEEBA BARBE, DOB [birth year], MRN [id number]  PCP:  Kandice Hams, MD    No chief complaint on file.    Wt Readings from Last 3 Encounters:  09/26/14 168 lb 12.8 oz (76.567 kg)  08/24/13 163 lb (73.936 kg)  11/18/12 163 lb 3.2 oz (74.027 kg)       History of Present Illness: Jacqueline Murphy is a 74 y.o. female  [name] is a 74 y.o. female who had a cardiomyopathy. It was likely nonischemic. Stress test was normal. Echo in 2014 showed improved LV function, EF 40%. Jacqueline Murphy hurt her back. Jacqueline Murphy is back to walking. Jacqueline Murphy has no problems with this. Jacqueline Murphy has some joint pains. BP in the morning 469 systolic. In the evening, it is 629-528 systolic. Cardiomyopathy:  Denies : Chest pain.  Shortness of breath.  Dizziness.  Leg edema.  Orthopnea.  Paroxysmal nocturnal dyspnea.  Palpitations.  Syncope.  Has had allergies in March, but they are better. Jacqueline Murphy sleeps well at night.  Occasional weight increase when Jacqueline Murphy does not exercise.  Resolved after a few weeks of exercise. Jacqueline Murphy has a cough when Jacqueline Murphy turns on her left side sometimes.   Jacqueline Murphy feels better the more Jacqueline Murphy walks.  Best when Jacqueline Murphy walks daily.    Past Medical History  Diagnosis Date  . Fibroid   . Osteopenia   . 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 Hypertension   . GERD (gastroesophageal reflux disease)   . CHF (congestive heart failure) 2011    Left heart failure    Past Surgical History  Procedure Laterality Date  . Abdominal hysterectomy  1990    TAH LSO  . Breast surgery  2008    Mastectomy-Left  . Breast surgery  2008    Reduction-Right  . Tubal ligation    . Oophorectomy      LSO     Current Outpatient Prescriptions  Medication Sig Dispense Refill  . aspirin 81 MG tablet Take 81 mg by mouth daily.    Marland Kitchen atorvastatin (LIPITOR) 10 MG tablet  TAKE 1 TABLET DAILY 90 tablet 0  . Calcium Carbonate-Vitamin D (CALCIUM + D PO) Take 1 tablet by mouth 2 (two) times daily.     . carvedilol (COREG) 12.5 MG tablet TAKE 1 TABLET (12.5 MG TOTAL) TWICE A DAY WITH A MEAL 180 tablet 0  . Cholecalciferol (VITAMIN D PO) Take 1,000 Units by mouth daily.     . fexofenadine (ALLEGRA) 180 MG tablet Take 180 mg by mouth as needed for allergies.     . furosemide (LASIX) 40 MG tablet TAKE ONE-HALF (1/2) TABLET (20 MG TOTAL) DAILY 45 tablet 0  . latanoprost (XALATAN) 0.005 % ophthalmic solution Place 1 drop into the right eye at bedtime.    . mometasone (NASONEX) 50 MCG/ACT nasal spray Place 1 spray into the nose as needed ((ALLERGIES)).     . Multiple Vitamin (MULTIVITAMIN) tablet Take 1 tablet by mouth daily.    . potassium chloride SA (KLOR-CON M20) 20 MEQ tablet Take 1 tablet (20 mEq total) by mouth 2 (two) times daily. 180 tablet 3  . RABEprazole (ACIPHEX) 20 MG tablet Take 20 mg by mouth daily.    . ramipril (ALTACE) 5 MG capsule TAKE 1 CAPSULE AT BEDTIME 90  capsule 0   No current facility-administered medications for this visit.    Allergies:   Review of patient's allergies indicates no known allergies.    Social History:  The patient  reports that Jacqueline Murphy quit smoking about 32 years ago. Jacqueline Murphy has never used smokeless tobacco. Jacqueline Murphy reports that Jacqueline Murphy does not drink alcohol or use illicit drugs.   Family History:  The patient's *family history includes Breast cancer in her maternal aunt; Cancer in her brother and mother; Diabetes in her other; Heart Problems in her father; Lung cancer in her mother.    ROS:  Please see the history of present illness.   Otherwise, review of systems are positive for occasional weight gain .   All other systems are reviewed and negative.    PHYSICAL EXAM: VS:  BP 130/72 mmHg  Pulse 77  Ht 5\' 5"  (1.651 m)  Wt 168 lb 12.8 oz (76.567 kg)  BMI 28.09 kg/m2 , BMI Body mass index is 28.09 kg/(m^2). GEN: Well nourished, well  developed, in no acute distress HEENT: normal Neck: no JVD, carotid bruits, or masses Cardiac: RRR; no murmurs, rubs, or gallops,no edema  Respiratory:  clear to auscultation bilaterally, normal work of breathing GI: soft, nontender, nondistended, + BS MS: no deformity or atrophy Skin: warm and dry, no rash Neuro:  Strength and sensation are intact Psych: euthymic mood, full affect   EKG:   The ekg ordered today demonstrates NSR, LBBB   Recent Labs: No results found for requested labs within last 365 days.   Lipid Panel No results found for: CHOL, TRIG, HDL, CHOLHDL, VLDL, LDLCALC, LDLDIRECT   Other studies Reviewed: Additional studies/ records that were reviewed today with results demonstrating: Echo from 2014 reviewed.   ASSESSMENT AND PLAN:  Cardiomyopathy  Continue Lasix Tablet, 40 MG, half tablet, Orally, Once a day No sx. Following low salt diet. Walking regularly.       Notes: No signs of CHF. Negative nuclear study in 2011.= Labs stable in 9/12. Rarely takes 40 mg daily. Last EF 40% in 2014.  No problems walking.  Jacqueline Murphy is not interested in BiV pacing at this time.  .  2. Essential hypertension, benign  Continue Ramipril Capsule, 5 MG, 1 capsule, Orally, Once a day Continue Carvedilol Tablet, 6.25 MG, 1 tablet with food, Orally, Twice a day Notes: Controlled today. Normal readings at home. COuld increase ramipiril if needed.Morning readings are better when Jacqueline Murphy takes the ramipril in the nighttime. .  3. LBBB  Present for many years.  Is wide enough that Jacqueline Murphy would be a candidate for BiV pacing, when Jacqueline Murphy has  CHF sx,    4. Significant reduction in LDL. LDL 65 in 4/14, down from 148. Continue atorvastatin. Followed by Dr. Delfina Redwood.  Will obtain most recent results.   Notes: Consider BiV pacing if Jacqueline Murphy has sx of CHF. Jacqueline Murphy will see how increased exercise goes. We discussed improved timing with biV pacing.           Current medicines are reviewed at length with  the patient today.  The patient concerns regarding her medicines were addressed.  The following changes have been made:  No change  Labs/ tests ordered today include:  No orders of the defined types were placed in this encounter.    Recommend 150 minutes/week of aerobic exercise Low fat, low carb, high fiber diet recommended  Disposition:   FU in 1 year   Teresita Madura., MD  09/26/2014 11:24 AM  [facility] [place], [place], [place]  [address] Phone: [phone]; Fax: [phone]

## 2014-09-29 ENCOUNTER — Other Ambulatory Visit: Payer: Self-pay | Admitting: Interventional Cardiology

## 2014-10-13 ENCOUNTER — Other Ambulatory Visit: Payer: Self-pay | Admitting: Interventional Cardiology

## 2014-11-11 ENCOUNTER — Other Ambulatory Visit: Payer: Self-pay | Admitting: Interventional Cardiology

## 2014-11-18 ENCOUNTER — Other Ambulatory Visit: Payer: Self-pay | Admitting: Interventional Cardiology

## 2014-11-19 ENCOUNTER — Other Ambulatory Visit: Payer: Self-pay

## 2014-11-19 MED ORDER — ATORVASTATIN CALCIUM 10 MG PO TABS: 10.0000 mg | ORAL_TABLET | Freq: Every day | ORAL | Status: DC

## 2014-11-19 MED ORDER — CARVEDILOL 12.5 MG PO TABS: ORAL_TABLET | ORAL | Status: DC

## 2015-01-30 ENCOUNTER — Other Ambulatory Visit: Payer: Self-pay | Admitting: Interventional Cardiology

## 2015-01-31 NOTE — Telephone Encounter (Signed)
Is the requested dose of carvedilol correct? This is what is listed on his chart, but at his office visit Dr Irish Lack has it as 6.25mg  . Essential hypertension, benign  Continue Ramipril Capsule, 5 MG, 1 capsule, Orally, Once a day Continue Carvedilol Tablet, 6.25 MG, 1 tablet with food, Orally, Twice a day Please advise. Thanks, MI

## 2015-02-01 NOTE — Telephone Encounter (Signed)
Spoke with pt and she states that she is taking Coreg 12.5mg  BID.

## 2015-02-07 NOTE — Telephone Encounter (Signed)
OK to continue what she is taking.

## 2015-06-08 ENCOUNTER — Other Ambulatory Visit: Payer: Self-pay | Admitting: Interventional Cardiology

## 2015-09-11 ENCOUNTER — Ambulatory Visit (INDEPENDENT_AMBULATORY_CARE_PROVIDER_SITE_OTHER): Payer: MEDICARE | Admitting: Interventional Cardiology

## 2015-09-11 ENCOUNTER — Encounter: Payer: Self-pay | Admitting: Interventional Cardiology

## 2015-09-11 VITALS — BP 120/60 | HR 96 | Ht 65.0 in | Wt 164.0 lb

## 2015-09-11 DIAGNOSIS — I447 Left bundle-branch block, unspecified: Secondary | ICD-10-CM | POA: Diagnosis not present

## 2015-09-11 DIAGNOSIS — I429 Cardiomyopathy, unspecified: Secondary | ICD-10-CM

## 2015-09-11 DIAGNOSIS — E782 Mixed hyperlipidemia: Secondary | ICD-10-CM | POA: Diagnosis not present

## 2015-09-11 DIAGNOSIS — I428 Other cardiomyopathies: Secondary | ICD-10-CM

## 2015-09-11 DIAGNOSIS — I10 Essential (primary) hypertension: Secondary | ICD-10-CM | POA: Diagnosis not present

## 2015-09-11 MED ORDER — FUROSEMIDE 40 MG PO TABS: 20.0000 mg | ORAL_TABLET | Freq: Every day | ORAL | Status: DC

## 2015-09-11 NOTE — Progress Notes (Signed)
Patient ID: Jacqueline Murphy, female   DOB: 07/10/1940, 75 y.o.   MRN: [id number]     Cardiology Office Note   Date:  09/11/2015   ID:  Jacqueline Murphy, DOB 1940-05-23, MRN [id number]  PCP:  Kandice Hams, MD    No chief complaint on file. f/u cardiomyopathy   Wt Readings from Last 3 Encounters:  09/11/15 164 lb (74.39 kg)  09/26/14 168 lb 12.8 oz (76.567 kg)  08/24/13 163 lb (73.936 kg)       History of Present Illness: Jacqueline Murphy is a 75 y.o. female  who had a cardiomyopathy. It was likely nonischemic. Stress test was normal. Echo in 2014 showed improved LV function, EF 40%.   She has been feeling well. She walks 5-6 times a week 30-40 minutes at a time. No chest discomfort or shortness of breath. No leg swelling. She has not had any heart issues in the past year. She did develop a cold over the past few days. She is wondering about what medication she can take for this.  She had a physical with Dr. polite when her blood tests were all checked at that time . Ago.    Past Medical History  Diagnosis Date  . Fibroid   . Osteopenia   . Cancer ([place]) 2008    LEFT Breast-Ductal CA in situ  . Hypertension   . GERD (gastroesophageal reflux disease)   . CHF (congestive heart failure) ([place]) 2011    Left heart failure    Past Surgical History  Procedure Laterality Date  . Abdominal hysterectomy  1990    TAH LSO  . Breast surgery  2008    Mastectomy-Left  . Breast surgery  2008    Reduction-Right  . Tubal ligation    . Oophorectomy      LSO     Current Outpatient Prescriptions  Medication Sig Dispense Refill  . atorvastatin (LIPITOR) 10 MG tablet Take 10 mg by mouth daily.    . carvedilol (COREG) 12.5 MG tablet Take 12.5 mg by mouth 2 (two) times daily with a meal.    . fexofenadine (ALLEGRA) 180 MG tablet Take 180 mg by mouth daily as needed for allergies or rhinitis.    . furosemide (LASIX) 40 MG tablet Take 0.5 tablets (20 mg total) by mouth  daily. TAKE ONE-HALF TAB DAILY 90 tablet 3  . mometasone (NASONEX) 50 MCG/ACT nasal spray Place 2 sprays into the nose daily as needed.    . potassium chloride SA (K-DUR,KLOR-CON) 20 MEQ tablet Take 20 mEq by mouth 2 (two) times daily.    . ramipril (ALTACE) 5 MG capsule Take 5 mg by mouth at bedtime.    Marland Kitchen aspirin 81 MG tablet Take 81 mg by mouth daily.    . Calcium Carbonate-Vitamin D (CALCIUM + D PO) Take 1 tablet by mouth 2 (two) times daily.     . Cholecalciferol (VITAMIN D PO) Take 1,000 Units by mouth daily.     Marland Kitchen latanoprost (XALATAN) 0.005 % ophthalmic solution Place 1 drop into the right eye at bedtime.    . Multiple Vitamin (MULTIVITAMIN) tablet Take 1 tablet by mouth daily.    . RABEprazole (ACIPHEX) 20 MG tablet Take 20 mg by mouth daily.     No current facility-administered medications for this visit.    Allergies:   Review of patient's allergies indicates no known allergies.    Social History:  The patient  reports that she quit smoking about 33 years  ago. She has never used smokeless tobacco. She reports that she does not drink alcohol or use illicit drugs.   Family History:  The patient's family history includes Breast cancer in her maternal aunt; Cancer in her brother and mother; Diabetes in her other; Heart Problems in her father; Hypertension in her brother; Lung cancer in her mother. There is no history of Heart attack or Stroke.    ROS:  Please see the history of present illness.   Otherwise, review of systems are positive for Recent URI symptoms with runny nose.   All other systems are reviewed and negative.    PHYSICAL EXAM: VS:  BP 120/60 mmHg  Pulse 96  Ht 5\' 5"  (1.651 m)  Wt 164 lb (74.39 kg)  BMI 27.29 kg/m2 , BMI Body mass index is 27.29 kg/(m^2). GEN: Well nourished, well developed, in no acute distress HEENT: normal Neck: no JVD, carotid bruits, or masses Cardiac: RRR; no murmurs, rubs, or gallops,no edema  Respiratory:  clear to auscultation  bilaterally, normal work of breathing GI: soft, nontender, nondistended, + BS MS: no deformity or atrophy Skin: warm and dry, no rash Neuro:  Strength and sensation are intact Psych: euthymic mood, full affect   EKG:   The ekg ordered today demonstrates NSR, LBBB   Recent Labs: No results found for requested labs within last 365 days.   Lipid Panel No results found for: CHOL, TRIG, HDL, CHOLHDL, VLDL, LDLCALC, LDLDIRECT   Other studies Reviewed: Additional studies/ records that were reviewed today with results demonstrating: LVEF 40% in 2014 .   ASSESSMENT AND PLAN:  1. Cardiomyopathy: No volume overload.  Continue current medications. No indication for echocardiogram at this time.  No angina. Presumed nonischemic given negative stress testing. 2. Hypertension: Well controlled. 3. Left bundle branch block: QRS is wide enough that if she would be a candidate for biventricular pacing if heart failure symptoms reappeared. 4. URI: Okay to try Coricidin HBP for her current symptoms.   Current medicines are reviewed at length with the patient today.  The patient concerns regarding her medicines were addressed.  The following changes have been made:  No change  Labs/ tests ordered today include:  Orders Placed This Encounter  Procedures  . EKG 12-Lead    Recommend 150 minutes/week of aerobic exercise Low fat, low carb, high fiber diet recommended  Disposition:   FU in 1 year   Teresita Madura., MD  09/11/2015 10:38 AM    [facility] [place], [place], [place]  [address] Phone: [phone]; Fax: [phone]

## 2015-09-11 NOTE — Patient Instructions (Signed)

## 2015-09-27 ENCOUNTER — Encounter: Payer: Self-pay | Admitting: Interventional Cardiology

## 2016-09-02 ENCOUNTER — Encounter: Payer: Self-pay | Admitting: Interventional Cardiology

## 2016-09-17 ENCOUNTER — Encounter: Payer: Self-pay | Admitting: *Deleted

## 2016-09-18 ENCOUNTER — Ambulatory Visit: Payer: MEDICARE | Admitting: Interventional Cardiology

## 2016-09-25 ENCOUNTER — Encounter: Payer: Self-pay | Admitting: Interventional Cardiology

## 2016-09-25 ENCOUNTER — Encounter (INDEPENDENT_AMBULATORY_CARE_PROVIDER_SITE_OTHER): Payer: Self-pay

## 2016-09-25 ENCOUNTER — Ambulatory Visit (INDEPENDENT_AMBULATORY_CARE_PROVIDER_SITE_OTHER): Payer: MEDICARE | Admitting: Interventional Cardiology

## 2016-09-25 VITALS — BP 142/88 | HR 86 | Ht 65.0 in | Wt 162.0 lb

## 2016-09-25 DIAGNOSIS — I447 Left bundle-branch block, unspecified: Secondary | ICD-10-CM

## 2016-09-25 DIAGNOSIS — I10 Essential (primary) hypertension: Secondary | ICD-10-CM | POA: Diagnosis not present

## 2016-09-25 DIAGNOSIS — I428 Other cardiomyopathies: Secondary | ICD-10-CM | POA: Diagnosis not present

## 2016-09-25 NOTE — Patient Instructions (Signed)

## 2016-09-25 NOTE — Progress Notes (Signed)
Patient ID: Jacqueline Murphy, female   DOB: 03/30/1941, 76 y.o.   MRN: [id number]     Cardiology Office Note   Date:  09/25/2016   ID:  Jacqueline Murphy, DOB 1940-07-10, MRN [id number]  PCP:  Seward Carol, MD    No chief complaint on file. f/u cardiomyopathy   Wt Readings from Last 3 Encounters:  09/25/16 162 lb (73.5 kg)  09/11/15 164 lb (74.4 kg)  09/26/14 168 lb 12.8 oz (76.6 kg)       History of Present Illness: Jacqueline Murphy is a 76 y.o. female  who had a cardiomyopathy. It was likely nonischemic. Stress test was normal. Echo in 2014 showed improved LV function, EF 40%.   She has been feeling well. She walks more now that the weather has improved.  Averages 3 times a week 30-40 minutes at a time. No chest discomfort or shortness of breath. No leg swelling. She has not had any heart issues in the past year.   She had cataract surgery in the past year.  She tolerated this well.   She had a physical with Dr. polite when her blood tests were all checked at that time.  She would like to get off of lipitor if possible.    She is trying to get off of her PPI as well.     Past Medical History:  Diagnosis Date  . Cancer ([place]) 2008   LEFT Breast-Ductal CA in situ  . CHF (congestive heart failure) ([place]) 2011   Left heart failure  . Fibroid   . GERD (gastroesophageal reflux disease)   . Hypertension   . Osteopenia     Past Surgical History:  Procedure Laterality Date  . ABDOMINAL HYSTERECTOMY  1990   TAH LSO  . BREAST SURGERY  2008   Mastectomy-Left  . BREAST SURGERY  2008   Reduction-Right  . OOPHORECTOMY     LSO  . TUBAL LIGATION       Current Outpatient Prescriptions  Medication Sig Dispense Refill  . aspirin 81 MG tablet Take 81 mg by mouth daily.    Marland Kitchen atorvastatin (LIPITOR) 10 MG tablet Take 10 mg by mouth daily.    . Calcium Carbonate-Vitamin D (CALCIUM + D PO) Take 1 tablet by mouth 2 (two) times daily.     . carvedilol (COREG) 12.5 MG  tablet Take 12.5 mg by mouth 2 (two) times daily with a meal.    . Cholecalciferol (VITAMIN D PO) Take 1,000 Units by mouth daily.     . fexofenadine (ALLEGRA) 180 MG tablet Take 180 mg by mouth daily as needed for allergies or rhinitis.    . furosemide (LASIX) 40 MG tablet Take 0.5 tablets (20 mg total) by mouth daily. TAKE ONE-HALF TAB DAILY 90 tablet 3  . latanoprost (XALATAN) 0.005 % ophthalmic solution Place 1 drop into the right eye at bedtime.    . mometasone (NASONEX) 50 MCG/ACT nasal spray Place 2 sprays into the nose daily as needed.    . Multiple Vitamin (MULTIVITAMIN) tablet Take 1 tablet by mouth daily.    . potassium chloride SA (K-DUR,KLOR-CON) 20 MEQ tablet Take 20 mEq by mouth 2 (two) times daily.    . RABEprazole (ACIPHEX) 20 MG tablet Take 20 mg by mouth daily.    . ramipril (ALTACE) 5 MG capsule Take 5 mg by mouth at bedtime.     No current facility-administered medications for this visit.     Allergies:   Patient has  no known allergies.    Social History:  The patient  reports that she quit smoking about 34 years ago. She has never used smokeless tobacco. She reports that she does not drink alcohol or use drugs.   Family History:  The patient's family history includes Breast cancer in her maternal aunt; Cancer in her brother and mother; Diabetes in her other; Heart Problems in her father; Hypertension in her brother; Lung cancer in her mother.    ROS:  Please see the history of present illness.   Otherwise, review of systems are positive for Recent URI symptoms with runny nose.   All other systems are reviewed and negative.    PHYSICAL EXAM: VS:  BP (!) 142/88   Pulse 86   Ht 5\' 5"  (1.651 m)   Wt 162 lb (73.5 kg)   SpO2 96%   BMI 26.96 kg/m  , BMI Body mass index is 26.96 kg/m. GEN: Well nourished, well developed, in no acute distress  HEENT: normal  Neck: no JVD, carotid bruits, or masses Cardiac: RRR; no murmurs, rubs, or gallops,no edema ; 2+ DP  bilaterally Respiratory:  clear to auscultation bilaterally, normal work of breathing GI: soft, nontender, nondistended, + BS MS: no deformity or atrophy  Skin: warm and dry, no rash Neuro:  Strength and sensation are intact Psych: euthymic mood, full affect   EKG:   The ekg ordered today demonstrates NSR, LBBB- no change from prior   Recent Labs: No results found for requested labs within last 8760 hours.   Lipid Panel No results found for: CHOL, TRIG, HDL, CHOLHDL, VLDL, LDLCALC, LDLDIRECT   Other studies Reviewed: Additional studies/ records that were reviewed today with results demonstrating: LVEF 40% in 2014 .   ASSESSMENT AND PLAN:  1. Cardiomyopathy: Diagnosed several years ago.  No volume overload and no problems when she walks for exercises, or goes up hills.  Continue current medications. No indication for echocardiogram at this time.  No angina. Presumed nonischemic given negative stress testing.  She never had a cath. 2. Hypertension: Well controlled.  Continue current meds.  Readings in the 120-130 range at home.  Limits salt intake. 3. Left bundle branch block: QRS is wide enough that if she would be a candidate for biventricular pacing if heart failure symptoms reappeared.   4. Advised her to try to minimize PPI use with lifestyle modifications if possible due to association with CKD.     Current medicines are reviewed at length with the patient today.  The patient concerns regarding her medicines were addressed.  The following changes have been made:  No change  Labs/ tests ordered today include:  No orders of the defined types were placed in this encounter.   Recommend 150 minutes/week of aerobic exercise Low fat, low carb, high fiber diet recommended  Disposition:   FU in 1 year   Signed, Larae Grooms, MD  09/25/2016 3:47 PM    [facility] [place], [place], [place]  [address] Phone: [phone]; Fax: [phone]

## 2016-10-30 ENCOUNTER — Other Ambulatory Visit: Payer: Self-pay | Admitting: Interventional Cardiology

## 2016-10-30 DIAGNOSIS — I10 Essential (primary) hypertension: Secondary | ICD-10-CM

## 2016-10-30 DIAGNOSIS — E782 Mixed hyperlipidemia: Secondary | ICD-10-CM

## 2017-06-15 ENCOUNTER — Telehealth: Payer: Self-pay | Admitting: Interventional Cardiology

## 2017-06-15 NOTE — Telephone Encounter (Signed)
Returned call to patient who states that she accidentally took her carvedilol twice this morning once at 8:00 AM and again at 11:00 AM. Patient states that her BP is 126/71 with HR 73. Patient denies having any symptoms. Patient normally takes carvedilol 12.5 mg BID. Advised patient to skip evening dose today and resume regular schedule tomorrow. Patient verbalized understanding and thanked me for the call.

## 2017-06-15 NOTE — Telephone Encounter (Signed)
Patient calling,   Pt c/o medication issue:  1. Name of Medication: Carvedilol  2. How are you currently taking this medication (dosage and times per day)? 12.5 mg  1 x daily  3. Are you having a reaction (difficulty breathing--STAT)? no  4. What is your medication issue? Patient states that she accidentally took two pills this morning, 1 at 8am and another at 11 am. Patient is concerned

## 2017-12-21 ENCOUNTER — Encounter: Payer: Self-pay | Admitting: Physician Assistant

## 2017-12-21 NOTE — Progress Notes (Signed)
Cardiology Office Note    Date:  12/22/2017  ID:  Jacqueline Murphy, DOB 02-17-1941, MRN [id number] PCP:  Seward Carol, MD  Cardiologist:  Larae Grooms, MD   Chief Complaint: yearly f/u cardiomyopathy  History of Present Illness:  Jacqueline Murphy is a 77 y.o. female with history of fibroids, breast CA, HTN, GERD, suspected NICM, atrial tach, mitral regurgitation, LBBB who presents for yearly follow-up. In 2008, EF was 55-60%. In 02/2010, EF 20-25% with moderate-severe MR. She had an atrial tachycardia diagnosed at the time felt possibly cause of her cardiomyopathy. No prior cath. Nuclear stress test 04/2018 showed no evidence of ischemia, EF 33%. F/u echo 2014 showed EF 40%, mild LVH, moderate asymmetric septal hypertrophy, mild MR. Last labs by [facility] 06/2017 LDL 64, trig 91, K 4.4, Cr 0.97, ALT 11, TSH 1.12, Hgb 13 in 02/2017 (followed by PCP).  She returns for follow-up feeling great. She denies any CP, SOB, edema, orthopnea, PND, palpitations. She walks regularly and stays "on the go." Compliant with meds. Wants to know exactly what kind of heart problem she has - we discussed her diagnoses today. She recalls back when HF was initially diagnosed, she was having nocturnal wheezing that was improved with acid reflux therapy. This has not recurred over the years.   Past Medical History:  Diagnosis Date  . Atrial tachycardia ([place])   . Cancer ([place]) 2008   LEFT Breast-Ductal CA in situ  . Fibroid   . GERD (gastroesophageal reflux disease)   . Hypertension   . LBBB (left bundle branch block)   . Mitral regurgitation   . NICM (nonischemic cardiomyopathy) ([place])    a. dx 2011, EF 25-30% ? tachy induced. b. EF 40% in 2014.  . Osteopenia     Past Surgical History:  Procedure Laterality Date  . ABDOMINAL HYSTERECTOMY  1990   TAH LSO  . BREAST SURGERY  2008   Mastectomy-Left  . BREAST SURGERY  2008   Reduction-Right  . OOPHORECTOMY     LSO  . TUBAL LIGATION      Current  Medications: Current Meds  Medication Sig  . aspirin 81 MG tablet Take 81 mg by mouth daily.  Marland Kitchen atorvastatin (LIPITOR) 10 MG tablet Take 10 mg by mouth daily.  . Calcium Carbonate-Vitamin D (CALCIUM + D PO) Take 1 tablet by mouth 2 (two) times daily.   . carvedilol (COREG) 12.5 MG tablet Take 12.5 mg by mouth 2 (two) times daily with a meal.  . Cholecalciferol (VITAMIN D PO) Take 1,000 Units by mouth daily.   . dorzolamide (TRUSOPT) 2 % ophthalmic solution Place 1 drop into the right eye 2 (two) times daily.  . fexofenadine (ALLEGRA) 180 MG tablet Take 180 mg by mouth daily as needed for allergies or rhinitis.  . furosemide (LASIX) 40 MG tablet Take 0.5 tablets (20 mg total) by mouth daily.  . mometasone (NASONEX) 50 MCG/ACT nasal spray Place 2 sprays into the nose daily as needed.  . Multiple Vitamin (MULTIVITAMIN) tablet Take 1 tablet by mouth daily.  . potassium chloride SA (K-DUR,KLOR-CON) 20 MEQ tablet Take 20 mEq by mouth 2 (two) times daily.  . RABEprazole (ACIPHEX) 20 MG tablet Take 20 mg by mouth daily.  . ramipril (ALTACE) 5 MG capsule Take 5 mg by mouth at bedtime.      Allergies:   Patient has no known allergies.   Social History   Socioeconomic History  . Marital status: Married    Spouse name:  Not on file  . Number of children: Not on file  . Years of education: Not on file  . Highest education level: Not on file  Occupational History  . Not on file  Social Needs  . Financial resource strain: Not on file  . Food insecurity:    Worry: Not on file    Inability: Not on file  . Transportation needs:    Medical: Not on file    Non-medical: Not on file  Tobacco Use  . Smoking status: Former Smoker    Last attempt to quit: 10/27/1981    Years since quitting: 36.1  . Smokeless tobacco: Never Used  Substance and Sexual Activity  . Alcohol use: No  . Drug use: No  . Sexual activity: Never    Birth control/protection: Surgical, Post-menopausal  Lifestyle  .  Physical activity:    Days per week: Not on file    Minutes per session: Not on file  . Stress: Not on file  Relationships  . Social connections:    Talks on phone: Not on file    Gets together: Not on file    Attends religious service: Not on file    Active member of club or organization: Not on file    Attends meetings of clubs or organizations: Not on file    Relationship status: Not on file  Other Topics Concern  . Not on file  Social History Narrative  . Not on file     Family History:  The patient's family history includes Breast cancer in her maternal aunt; Cancer in her brother and mother; Diabetes in her other; Heart Problems in her father; Hypertension in her brother; Lung cancer in her mother. There is no history of Heart attack or Stroke.  ROS:   Please see the history of present illness.  All other systems are reviewed and otherwise negative.    PHYSICAL EXAM:   VS:  BP 126/78   Pulse 71   Ht 5\' 5"  (1.651 m)   Wt 159 lb 1.9 oz (72.2 kg)   BMI 26.48 kg/m   BMI: Body mass index is 26.48 kg/m. GEN: Well nourished, well developed AAF, in no acute distress HEENT: normocephalic, atraumatic Neck: no JVD, carotid bruits, or masses Cardiac: RRR; no murmurs, rubs, or gallops, no edema  Respiratory:  clear to auscultation bilaterally, normal work of breathing GI: soft, nontender, nondistended, + BS MS: no deformity or atrophy Skin: warm and dry, no rash Neuro:  Alert and Oriented x 3, Strength and sensation are intact, follows commands Psych: euthymic mood, full affect  Wt Readings from Last 3 Encounters:  12/22/17 159 lb 1.9 oz (72.2 kg)  09/25/16 162 lb (73.5 kg)  09/11/15 164 lb (74.4 kg)      Studies/Labs Reviewed:   EKG:  EKG was ordered today and personally reviewed by me and demonstrates NSR 71bpm LBBB with nonspecific changes similar to prior  Recent Labs: No results found for requested labs within last 8760 hours.   Lipid Panel No results found  for: CHOL, TRIG, HDL, CHOLHDL, VLDL, LDLCALC, LDLDIRECT  Additional studies/ records that were reviewed today include: Summarized above  ASSESSMENT & PLAN:   1. Suspected NICM  - clinically stable and doing well. She is NYHA class I. It has been 5 years since last echo, and time to update given mitral regurgitation. Will order. Spent time discussing her diagnoses with her. Reviewed 2g sodium restriction, 2L fluid restriction, daily weights with patient. Fortunately she  has maintained euvolemia over the years. 2. Atrial tachycardia - quiescent. Continue BB. 3. Mitral regurgitation - per guidelines, due for surveillance echo now that we are 5 years out from last one. Will obtain. 4. LBBB - no symptoms of bradycardia. Continue to monitor.  Disposition: F/u with Dr. Irish Lack in 1 year.  Medication Adjustments/Labs and Tests Ordered: Current medicines are reviewed at length with the patient today.  Concerns regarding medicines are outlined above. Medication changes, Labs and Tests ordered today are summarized above and listed in the Patient Instructions accessible in Encounters.   Signed, Charlie Pitter, PA-C  12/22/2017 9:32 AM    [address], [place], [place]  [address] Phone: [phone]; Fax: [phone]

## 2017-12-22 ENCOUNTER — Ambulatory Visit (INDEPENDENT_AMBULATORY_CARE_PROVIDER_SITE_OTHER): Payer: MEDICARE | Admitting: Physician Assistant

## 2017-12-22 ENCOUNTER — Encounter: Payer: Self-pay | Admitting: Physician Assistant

## 2017-12-22 VITALS — BP 126/78 | HR 71 | Ht 65.0 in | Wt 159.1 lb

## 2017-12-22 DIAGNOSIS — I447 Left bundle-branch block, unspecified: Secondary | ICD-10-CM | POA: Diagnosis not present

## 2017-12-22 DIAGNOSIS — I471 Supraventricular tachycardia: Secondary | ICD-10-CM

## 2017-12-22 DIAGNOSIS — I340 Nonrheumatic mitral (valve) insufficiency: Secondary | ICD-10-CM | POA: Diagnosis not present

## 2017-12-22 DIAGNOSIS — I428 Other cardiomyopathies: Secondary | ICD-10-CM | POA: Diagnosis not present

## 2017-12-22 NOTE — Patient Instructions (Addendum)
Medication Instructions:   Your physician recommends that you continue on your current medications as directed. Please refer to the Current Medication list given to you today.   If you need a refill on your cardiac medications before your next appointment, please call your pharmacy.  Labwork: NONE ORDERED  TODAY   Testing/Procedures: Your physician has requested that you have an echocardiogram. Echocardiography is a painless test that uses sound waves to create images of your heart. It provides your doctor with information about the size and shape of your heart and how well your heart's chambers and valves are working. This procedure takes approximately one hour. There are no restrictions for this procedure.   Follow-Up: Your physician wants you to follow-up in: [place] will receive a reminder letter in the mail two months in advance. If you don't receive a letter, please call our office to schedule the follow-up appointment.   Any Other Special Instructions Will Be Listed Below (If Applicable).                                                                                                                                          You have the following heart diagnoses: - heart muscle weakness. Normal heart pumping function is 55-60%; yours was last checked in 2014 and was 40%.  - left bundle branch block  - mild leaking of the mitral valve   For patients with weakness of the heart muscle, we give them these special instructions:  1. Follow a low-salt diet - you are allowed no more than 2,000mg  of sodium per day. Watch your fluid intake. In general, you should not be taking in more than 2 liters of fluid per day (no more than 8 glasses per day). This includes sources of water in foods like soup, coffee, tea, milk, etc. 2. Weigh yourself on the same scale at same time of day and keep a log. 3. Call your doctor: (Anytime you feel any of the following symptoms)  - 3lb  weight gain overnight or 5lb within a few days - Shortness of breath, with or without a dry hacking cough  - Swelling in the hands, feet or stomach  - If you have to sleep on extra pillows at night in order to breathe   IT IS IMPORTANT TO LET YOUR DOCTOR KNOW EARLY ON IF YOU ARE HAVING SYMPTOMS SO WE CAN HELP YOU!

## 2017-12-28 ENCOUNTER — Other Ambulatory Visit (HOSPITAL_COMMUNITY): Payer: MEDICARE

## 2018-01-07 ENCOUNTER — Other Ambulatory Visit (HOSPITAL_COMMUNITY): Payer: MEDICARE

## 2018-01-18 ENCOUNTER — Other Ambulatory Visit: Payer: Self-pay | Admitting: Interventional Cardiology

## 2018-01-18 DIAGNOSIS — I10 Essential (primary) hypertension: Secondary | ICD-10-CM

## 2018-01-18 DIAGNOSIS — E782 Mixed hyperlipidemia: Secondary | ICD-10-CM

## 2018-02-04 ENCOUNTER — Ambulatory Visit (HOSPITAL_COMMUNITY): Payer: MEDICARE | Attending: Internal Medicine

## 2018-02-04 ENCOUNTER — Other Ambulatory Visit: Payer: Self-pay

## 2018-02-04 DIAGNOSIS — I119 Hypertensive heart disease without heart failure: Secondary | ICD-10-CM | POA: Diagnosis not present

## 2018-02-04 DIAGNOSIS — I428 Other cardiomyopathies: Secondary | ICD-10-CM

## 2018-02-04 DIAGNOSIS — I447 Left bundle-branch block, unspecified: Secondary | ICD-10-CM | POA: Diagnosis not present

## 2018-02-04 DIAGNOSIS — Z853 Personal history of malignant neoplasm of breast: Secondary | ICD-10-CM | POA: Diagnosis not present

## 2018-02-04 DIAGNOSIS — I340 Nonrheumatic mitral (valve) insufficiency: Secondary | ICD-10-CM

## 2018-02-04 DIAGNOSIS — E785 Hyperlipidemia, unspecified: Secondary | ICD-10-CM | POA: Diagnosis not present

## 2018-02-08 ENCOUNTER — Ambulatory Visit: Payer: MEDICARE | Admitting: Cardiology

## 2018-02-18 ENCOUNTER — Encounter: Payer: Self-pay | Admitting: Physician Assistant

## 2018-03-04 ENCOUNTER — Encounter: Payer: Self-pay | Admitting: Physician Assistant

## 2018-03-04 NOTE — Progress Notes (Addendum)
Cardiology Office Note    Date:  03/08/2018  ID:  Jacqueline Murphy, DOB 23-Jan-1941, MRN [id number] PCP:  Seward Carol, MD  Cardiologist:  Larae Grooms, MD   Chief Complaint: f/u to   History of Present Illness:  Jacqueline Murphy is a 77 y.o. female with history of fibroids, breast CA (tx with mastectomy only), HTN, GERD, suspected NICM, atrial tach, mitral regurgitation, LBBB who presents for yearly follow-up. In 2008, EF was 55-60%. In 02/2010, echo showed EF 20-25% with moderate-severe MR. She had an atrial tachycardia diagnosed at the time felt possibly cause of her cardiomyopathy. No prior cath. Nuclear stress test 04/2010 showed no evidence of ischemia, EF 33%. F/u echo 2014 showed EF 40%, mild LVH, moderate asymmetric septal hypertrophy, mild MR. Back when HF was initially diagnosed, she was having nocturnal wheezing that was improved with acid reflux therapy and had not recurred. More recently I met the patient in 01/2018 reporting that she was feeling great, walking regularly, without any CP, SOB, edema, orthopnea, PND, palpitations. She reported compliance with meds. Updated echocardiogram 02/04/18 showed mild LVH, EF 30-35%, diffuse HK, mild MR, mildly dilated RV. Last labs by [facility] 06/2017 LDL 64, trig 91, K 4.4, Cr 0.97, ALT 11, TSH 1.12, Hgb 13 in 02/2017 (followed by PCP).  She returns for follow-up today to discuss her echo results. She affirms she is feeling great without any new cardiac symptoms. Her insurance changed her ramipril to benazepril. She's been noting intermittently higher BP at home, one day 120 but other days 756-433 systolic. It was elevated in clinic today. Reports compliance with meds.    Past Medical History:  Diagnosis Date  . Atrial tachycardia ([place])   . Cancer ([place]) 2008   LEFT Breast-Ductal CA in situ  . Fibroid   . GERD (gastroesophageal reflux disease)   . Hypertension   . LBBB (left bundle branch block)   . Mitral regurgitation   . NICM  (nonischemic cardiomyopathy) ([place])    a. dx 2011, EF 25-30% ? tachy induced. b. EF 40% in 2014. c. EF 30-35% in 01/2018, grade 1 DD.  . Osteopenia     Past Surgical History:  Procedure Laterality Date  . ABDOMINAL HYSTERECTOMY  1990   TAH LSO  . BREAST SURGERY  2008   Mastectomy-Left  . BREAST SURGERY  2008   Reduction-Right  . OOPHORECTOMY     LSO  . TUBAL LIGATION      Current Medications: Current Meds  Medication Sig  . aspirin 81 MG tablet Take 81 mg by mouth daily.  Marland Kitchen atorvastatin (LIPITOR) 10 MG tablet Take 10 mg by mouth daily.  . benazepril (LOTENSIN) 10 MG tablet Take 10 mg by mouth daily.  . Calcium Carbonate-Vitamin D (CALCIUM + D PO) Take 1 tablet by mouth 2 (two) times daily.   . carvedilol (COREG) 12.5 MG tablet Take 12.5 mg by mouth 2 (two) times daily with a meal.  . Cholecalciferol (VITAMIN D PO) Take 1,000 Units by mouth daily.   . dorzolamide (TRUSOPT) 2 % ophthalmic solution Place 1 drop into the right eye 2 (two) times daily.  . fexofenadine (ALLEGRA) 180 MG tablet Take 180 mg by mouth daily as needed for allergies or rhinitis.  . furosemide (LASIX) 40 MG tablet Take 0.5 tablets (20 mg total) by mouth daily.  . mometasone (NASONEX) 50 MCG/ACT nasal spray Place 2 sprays into the nose daily as needed.  . Multiple Vitamin (MULTIVITAMIN) tablet Take 1 tablet by  mouth daily.  . potassium chloride SA (K-DUR,KLOR-CON) 20 MEQ tablet Take 20 mEq by mouth 2 (two) times daily.      Allergies:   Patient has no known allergies.   Social History   Socioeconomic History  . Marital status: Married    Spouse name: Not on file  . Number of children: Not on file  . Years of education: Not on file  . Highest education level: Not on file  Occupational History  . Not on file  Social Needs  . Financial resource strain: Not on file  . Food insecurity:    Worry: Not on file    Inability: Not on file  . Transportation needs:    Medical: Not on file    Non-medical: Not  on file  Tobacco Use  . Smoking status: Former Smoker    Last attempt to quit: 10/27/1981    Years since quitting: 36.3  . Smokeless tobacco: Never Used  Substance and Sexual Activity  . Alcohol use: No  . Drug use: No  . Sexual activity: Never    Birth control/protection: Surgical, Post-menopausal  Lifestyle  . Physical activity:    Days per week: Not on file    Minutes per session: Not on file  . Stress: Not on file  Relationships  . Social connections:    Talks on phone: Not on file    Gets together: Not on file    Attends religious service: Not on file    Active member of club or organization: Not on file    Attends meetings of clubs or organizations: Not on file    Relationship status: Not on file  Other Topics Concern  . Not on file  Social History Narrative  . Not on file     Family History:  The patient's family history includes Breast cancer in her maternal aunt; Cancer in her brother and mother; Diabetes in her other; Heart Problems in her father; Hypertension in her brother; Lung cancer in her mother. There is no history of Heart attack or Stroke.  ROS:   Please see the history of present illness. All other systems are reviewed and otherwise negative.    PHYSICAL EXAM:   VS:  BP (!) 168/78   Pulse 75   Ht '5\' 5"'$  (1.651 m)   Wt 161 lb 6.4 oz (73.2 kg)   SpO2 98%   BMI 26.86 kg/m   BMI: Body mass index is 26.86 kg/m. GEN: Well nourished, well developed elderly AAF, in no acute distress HEENT: normocephalic, atraumatic Neck: no JVD, carotid bruits, or masses Cardiac: RRR; no murmurs, rubs, or gallops, no edema  Respiratory:  clear to auscultation bilaterally, normal work of breathing GI: soft, nontender, nondistended, + BS MS: no deformity or atrophy Skin: warm and dry, no rash Neuro:  Alert and Oriented x 3, Strength and sensation are intact, follows commands Psych: euthymic mood, full affect  Wt Readings from Last 3 Encounters:  03/08/18 161 lb 6.4  oz (73.2 kg)  12/22/17 159 lb 1.9 oz (72.2 kg)  09/25/16 162 lb (73.5 kg)      Studies/Labs Reviewed:   EKG: EKG was not ordered today.  Recent Labs: No results found for requested labs within last 8760 hours.   Lipid Panel No results found for: CHOL, TRIG, HDL, CHOLHDL, VLDL, LDLCALC, LDLDIRECT  Additional studies/ records that were reviewed today include: Summarized above.   ASSESSMENT & PLAN:   1. Non-ischemic cardiomyopathy - EF remains low, but now  at this point has fallen beneath the cutoff where we may consider candidacy for ICD. With longstanding LV dysfunction and LBBB I wonder if she is a candidate for CRT-D. Interestingly she remains NYHA class 1 without any functional limitation from a cardiac standpoint therefore I'm not sure she would qualify for Entresto. BP is suboptimally controlled with recent insurance switch from ramipril to benazepril. Will start with further med titration with increase in benazepril to '20mg'$  nightly (she takes in PM). Check BMET and baseline CBC today for updated values. I have asked her call in with BP readings on Friday at which I might consider adding spironolactone to regimen versus changing Lasix to spiro. I work this weekend and can review the chart when it comes through. Will arrange pharmD visit in 2 weeks for continued med titration. She appears euvolemic. I will also plan to review her scenario with EP. I had preliminarily discussed her situation with Dr. Curt Bears last week regarding whether she would need ischemic testing prior to consideration of device, and he does not feel that would be indicated given lack of symptoms and chronicity of LV dysfunction. Reviewed testing and 2g sodium restriction, 2L fluid restriction, daily weights with patient. Addendum: I reviewed with Dr. Curt Bears. Given her lack of symptoms related to her cardiomyopathy he feels the risk may outweigh the benefit to consideration of CRT-D before medications have been  optimized. He agrees with plan to continue to optimize meds, and determine timing of repeat echo likely 3 months after on max medical therapy. Will continue to follow closely as outpatient. 2. LBBB - as above. Chronic. 3. History of atrial tachycardia - quiescent on beta blocker. 4. Essential HTN -follow BP with med changes above.  Disposition: F/u with pharmD in 2 weeks, myself in 6 weeks.   Medication Adjustments/Labs and Tests Ordered: Current medicines are reviewed at length with the patient today.  Concerns regarding medicines are outlined above. Medication changes, Labs and Tests ordered today are summarized above and listed in the Patient Instructions accessible in Encounters.   Signed, Charlie Pitter, PA-C  03/08/2018 9:31 AM    [address], [place], [place]  [address] Phone: [phone]; Fax: [phone]

## 2018-03-08 ENCOUNTER — Ambulatory Visit (INDEPENDENT_AMBULATORY_CARE_PROVIDER_SITE_OTHER): Payer: MEDICARE | Admitting: Physician Assistant

## 2018-03-08 ENCOUNTER — Encounter: Payer: Self-pay | Admitting: Physician Assistant

## 2018-03-08 VITALS — BP 168/78 | HR 75 | Ht 65.0 in | Wt 161.4 lb

## 2018-03-08 DIAGNOSIS — Z8679 Personal history of other diseases of the circulatory system: Secondary | ICD-10-CM

## 2018-03-08 DIAGNOSIS — I428 Other cardiomyopathies: Secondary | ICD-10-CM | POA: Diagnosis not present

## 2018-03-08 DIAGNOSIS — I447 Left bundle-branch block, unspecified: Secondary | ICD-10-CM

## 2018-03-08 DIAGNOSIS — I10 Essential (primary) hypertension: Secondary | ICD-10-CM

## 2018-03-08 MED ORDER — BENAZEPRIL HCL 20 MG PO TABS: 20.0000 mg | ORAL_TABLET | Freq: Every day | ORAL | 3 refills | Status: DC

## 2018-03-08 NOTE — Addendum Note (Signed)
Addended by: Gaetano Net on: 03/08/2018 03:09 PM   Modules accepted: Orders

## 2018-03-08 NOTE — Patient Instructions (Signed)
Medication Instructions:  Your physician has recommended you make the following change in your medication:  1.  INCREASE the Benazepril to 20 mg in the p.m  (TAKE 2 OF THE 10 MG TABLETS THAT YOU HAVE UNTIL YOUR NEW PRESCRIPTION COMES IN THE NAIL)  If you need a refill on your cardiac medications before your next appointment, please call your pharmacy.   Lab work: TODAY:  BMET & CBC  If you have labs (blood work) drawn today and your tests are completely normal, you will receive your results only by: Marland Kitchen MyChart Message (if you have MyChart) OR . A paper copy in the mail If you have any lab test that is abnormal or we need to change your treatment, we will call you to review the results.  Testing/Procedures: None ordered  Follow-Up: . Your physician recommends that you schedule a follow-up appointment in: [address] [place]   Any Other Special Instructions Will Be Listed Below (If Applicable). CHECK YOUR BLOOD PRESSURE DAILY.  CALL us WITH READINGS ON Friday, 03/11/18

## 2018-03-09 ENCOUNTER — Telehealth: Payer: Self-pay | Admitting: *Deleted

## 2018-03-09 LAB — BASIC METABOLIC PANEL
BUN/Creatinine Ratio: 12 (ref 12–28)
BUN: 12 mg/dL (ref 8–27)
CALCIUM: 9.7 mg/dL (ref 8.7–10.3)
CO2: 23 mmol/L (ref 20–29)
CREATININE: 1.04 mg/dL — AB (ref 0.57–1.00)
Chloride: 100 mmol/L (ref 96–106)
GFR calc Af Amer: 60 mL/min/{1.73_m2} (ref >59)
GFR, EST NON AFRICAN AMERICAN: 52 mL/min/{1.73_m2} — AB (ref >59)
GLUCOSE: 96 mg/dL (ref 65–99)
Potassium: 4.7 mmol/L (ref 3.5–5.2)
Sodium: 138 mmol/L (ref 134–144)

## 2018-03-09 LAB — CBC
HEMATOCRIT: 36.9 % (ref 34.0–46.6)
HEMOGLOBIN: 12 g/dL (ref 11.1–15.9)
MCH: 26.8 pg (ref 26.6–33.0)
MCHC: 32.5 g/dL (ref 31.5–35.7)
MCV: 82 fL (ref 79–97)
RBC: 4.48 x10E6/uL (ref 3.77–5.28)
RDW: 15.3 % (ref 12.3–15.4)
WBC: 6.4 10*3/uL (ref 3.4–10.8)

## 2018-03-09 NOTE — Telephone Encounter (Signed)
-----   Message from Charlie Pitter, [place] sent at 03/09/2018  8:22 AM EDT ----- Please call patient. Labs are stable. Potassium is actually upper limits of normal so I would go ahead and stop potassium supplement for now. I had asked her to call in with BP readings after on higher dose of benazepril a few days at which I might consider adding spironolactone to regimen versus changing Lasix to spiro. I work this weekend and can review the chart when it comes through.   Please let her know I also discussed her case with Dr. Curt Bears. Given her lack of symptoms and that she is not yet on what we call optimal medical therapy, he does not yet feel he would proceed with cardiac resynchronization/defibrillator device. He agrees with the course we are taking with gradually adjusting her medications to optimize her regimen for heart function - I anticipate in follow-up we will discuss timing of her repeat echo to decide if this needs to be revisited, or if heart function possibly improves with the changes we are making. To put it in perspective, we consider this device if her EF is 35% or less - she is 30-35%. In the past she has been as high as 40% so if we are able to get her back to that (or better) she would not need a defibrillator. She should call with any new symptoms in the meantime. Dayna Dunn PA-C

## 2018-03-11 ENCOUNTER — Telehealth: Payer: Self-pay | Admitting: Physician Assistant

## 2018-03-11 DIAGNOSIS — Z8679 Personal history of other diseases of the circulatory system: Secondary | ICD-10-CM

## 2018-03-11 DIAGNOSIS — I428 Other cardiomyopathies: Secondary | ICD-10-CM

## 2018-03-11 DIAGNOSIS — I447 Left bundle-branch block, unspecified: Secondary | ICD-10-CM

## 2018-03-11 DIAGNOSIS — I10 Essential (primary) hypertension: Secondary | ICD-10-CM

## 2018-03-11 NOTE — Telephone Encounter (Signed)
Will forward readings to [employer] PA

## 2018-03-11 NOTE — Telephone Encounter (Signed)
New Message        Patient was told to call and leave B/P. Patient weighed @ 8:30 AM, BP 3:40 PM    10/30 WT 158.5 BP 139/73  10/31 wt   157.8 bP 130/79 11/01  WT  157.7BP 140/80

## 2018-03-14 NOTE — Telephone Encounter (Signed)
Please call patient. I reviewed her case with Dr. Haroldine Laws with advanced heart failure team over the weekend. As we have discussed, it's not been totally clear why her EF has remained so low over the years despite medication. At this time let's do the following:  - add spironolactone 12.5mg  daily - change Lasix to only as needed for shortness of breath, weight gain of 3-5lb or swelling (instead of scheduled daily) - check BMET within 4-5 days; please call in with BP readings and weight at that time - instead of following up with our pharmacist for med titration and myself, Dr. Haroldine Laws said he would be happy to assist in her care to continue to try and optimize her heart function. Please refer to CHF clinic to see Dr. Haroldine Laws. We discussed that since she has felt she has been relatively asymptomatic, she may benefit from a type of test called a CPX but I would like to get his input first.  Please thank her for allowing me to participate in her care and being such a compliant patient. She is the type of patient we have the best chance of helping long term because she is clearly invested in her health.  Thanks [employer] PA-C

## 2018-03-14 NOTE — Telephone Encounter (Signed)
Called pt re: Jacqueline Copa, PA-C's message below. Left a message for her to call back.

## 2018-03-15 ENCOUNTER — Telehealth: Payer: Self-pay | Admitting: *Deleted

## 2018-03-15 ENCOUNTER — Other Ambulatory Visit: Payer: Self-pay | Admitting: Physician Assistant

## 2018-03-15 MED ORDER — SPIRONOLACTONE 25 MG PO TABS: 25.0000 mg | ORAL_TABLET | Freq: Every day | ORAL | 3 refills | Status: DC

## 2018-03-15 MED ORDER — SPIRONOLACTONE 25 MG PO TABS: 12.5000 mg | ORAL_TABLET | Freq: Every day | ORAL | 0 refills | Status: DC

## 2018-03-15 MED ORDER — FUROSEMIDE 40 MG PO TABS: 20.0000 mg | ORAL_TABLET | Freq: Every day | ORAL | 3 refills | Status: DC | PRN

## 2018-03-15 MED ORDER — FUROSEMIDE 40 MG PO TABS: ORAL_TABLET | ORAL | 3 refills | Status: DC

## 2018-03-15 MED ORDER — SPIRONOLACTONE 25 MG PO TABS: 12.5000 mg | ORAL_TABLET | Freq: Every day | ORAL | 3 refills | Status: DC

## 2018-03-15 NOTE — Addendum Note (Signed)
Addended by: Gaetano Net on: 03/15/2018 08:44 AM   Modules accepted: Orders

## 2018-03-15 NOTE — Telephone Encounter (Signed)
error 

## 2018-03-15 NOTE — Addendum Note (Signed)
Addended by: Gaetano Net on: 03/15/2018 09:12 AM   Modules accepted: Orders

## 2018-03-15 NOTE — Telephone Encounter (Signed)
Follow up   Patient has additional questions about the below medications. Please call to discuss.

## 2018-03-15 NOTE — Telephone Encounter (Signed)
Returned pts call.  She had received a call from [facility] saying they couldn't fill her Spironolactone, that it was too early. I advised the pt I would call them to see what was going on. Called [facility], they have fixed the error and will call the pt to come pick it up.

## 2018-03-15 NOTE — Telephone Encounter (Signed)
Spoke with pt re: message below from [employer], [place].  Pt is understanding to the following changes and recommendations:  Change Lasix to PRN only for swelling, sob, and weight gain of 3-5 lbs Start Spironolactone 12.5 mg daily Check weight and bp daily and bring a log when she has her lab work done [employer] 03/21/18 Referral to [facility], Dr. Missy Sabins.  Pt verbalized understanding and all questions, if any, were answered.

## 2018-03-21 ENCOUNTER — Telehealth: Payer: Self-pay | Admitting: *Deleted

## 2018-03-21 ENCOUNTER — Other Ambulatory Visit: Payer: MEDICARE | Admitting: *Deleted

## 2018-03-21 DIAGNOSIS — I447 Left bundle-branch block, unspecified: Secondary | ICD-10-CM

## 2018-03-21 DIAGNOSIS — I10 Essential (primary) hypertension: Secondary | ICD-10-CM

## 2018-03-21 DIAGNOSIS — Z8679 Personal history of other diseases of the circulatory system: Secondary | ICD-10-CM

## 2018-03-21 DIAGNOSIS — I428 Other cardiomyopathies: Secondary | ICD-10-CM

## 2018-03-21 NOTE — Telephone Encounter (Signed)
Thank you! Will await BMET.   PA-C

## 2018-03-21 NOTE — Telephone Encounter (Signed)
Pt came for her BMET today and dropped the bp readings off.  See below:     WEIGHT   BP 10/30   158.5    139/73 10/31   157.8    130/79 11/1   157.7    140/80 11/2   158.7    138/81 11/3   158.2    142/83 11/4   158.2    11/? 11/5   157.9    137/80 11/6   157.7    127/44 11/7   158.2    134/72 11/8   159.1    136/79 11/9   159.2    144/83 11/10   157.6    129/75 11/11   159.7    No bp

## 2018-03-22 ENCOUNTER — Telehealth: Payer: Self-pay | Admitting: *Deleted

## 2018-03-22 ENCOUNTER — Ambulatory Visit: Payer: PRIVATE HEALTH INSURANCE

## 2018-03-22 LAB — BASIC METABOLIC PANEL
BUN / CREAT RATIO: 16 (ref 12–28)
BUN: 15 mg/dL (ref 8–27)
CO2: 23 mmol/L (ref 20–29)
Calcium: 9.8 mg/dL (ref 8.7–10.3)
Chloride: 107 mmol/L — ABNORMAL HIGH (ref 96–106)
Creatinine, Ser: 0.96 mg/dL (ref 0.57–1.00)
GFR calc non Af Amer: 58 mL/min/{1.73_m2} — ABNORMAL LOW (ref >59)
GFR, EST AFRICAN AMERICAN: 66 mL/min/{1.73_m2} (ref >59)
GLUCOSE: 88 mg/dL (ref 65–99)
Potassium: 4.6 mmol/L (ref 3.5–5.2)
SODIUM: 143 mmol/L (ref 134–144)

## 2018-03-22 NOTE — Telephone Encounter (Signed)
Called pt re: lab results, left a message for her to call back  

## 2018-03-22 NOTE — Telephone Encounter (Signed)
-----   Message from Charlie Pitter, [place] sent at 03/22/2018  7:51 AM EST ----- Please let patient know labs are stable. How is she feeling? Will base next steps for meds on this information. It also looks like HF appt is still pending scheduling Dayna Dunn PA-C

## 2018-03-24 NOTE — Telephone Encounter (Signed)
New message:  Pt is returning a call for lab results. 

## 2018-03-24 NOTE — Telephone Encounter (Signed)
Pt aware of lab results see result notes ./cy

## 2018-03-25 ENCOUNTER — Telehealth (HOSPITAL_COMMUNITY): Payer: Self-pay | Admitting: Cardiology

## 2018-03-25 NOTE — Telephone Encounter (Signed)
PT RETURNED CALL TO SCHEDULE APPT NP APPT 12/6 @ 9 AM APP CLINIC  PT AWARE AND VOICED UNDERSTANDING NP INFORMATION GIVEN

## 2018-03-25 NOTE — Telephone Encounter (Signed)
Follow up  ° ° °Patient is returning call.  °

## 2018-03-25 NOTE — Telephone Encounter (Signed)
NP appt needed (decreased EF) [facility] referral (Dayna) Per Amy Clegg,NP ok for patient to be scheduled on APP side  Left message for patient to return call

## 2018-03-29 ENCOUNTER — Telehealth: Payer: Self-pay

## 2018-03-29 NOTE — Telephone Encounter (Signed)
Pt walked in to question that her Spironolactone  Bottle has 1 po qd and she had been on 0.5 po qd.. I reviewed her chart and med list and when the med was sent in initially on 03/15/18 it was sent in for 1 tab but immediately cancelled and sent in with the correct dose.Marland Kitchen However the pharmacy changed her directions to one a day but gave her the correct amount.. 45 as her 90 day supply.Marland Kitchen Spoke with [facility] and they are going to be sure that they change the Sig and I altered the pts bottle with the right directions.. Pharmacy offered to send her another label but the pt declined. She verbalized understanding.

## 2018-04-14 NOTE — Progress Notes (Signed)
Advanced Heart Failure Clinic Consult Note   Referring Physician: PCP: Seward Carol, MD PCP-Cardiologist: Larae Grooms, MD   HPI:  Jacqueline Murphy is a 77 y.o. female with h/o Chronic systolic CHF, fibroids, breast CA (tx with mastectomy only), HTN, GERD, Atrial Tach, MR, and LBBB referred by Melina Copa, PA-C for further evaluation of HF.   Echo 2008 showed EF 55-60% Echo 02/2010 showed fall in EF to 20-25% with moderate-severe MR. She had an atrial tachycardia diagnosed at the time felt possibly cause of her cardiomyopathy. No prior cath.  Nuclear stress test 04/2010 showed no evidence of ischemia, EF 33%.  Echo 07/2012 showed EF 40%, mild LVH, moderate asymmetric septal hypertrophy, mild MR.  Most recent Echo 02/04/18 showed LVEF 30-35%, Grade 1 DD, Mild MR, Normal LA size, Mild VR dilation.   Last seen in [facility] clinic 04/08/18 to follow up on Echo results as above. Denied new cardiac symptoms. Insurance had changed ramipril to benazepril. BP elevated in clinic at 168/78 despite taking her medications that am. Melina Copa, PA-C discussed with Dr. Haroldine Laws, who recommended pt be seen in the CHF clinic for med titration and CPX with reported NYHA I-II symptoms. Case also reviewed by Dr. Curt Bears, and not candidate for ICD/CRT-D with NYHA I symptoms and lack of medical optimization. Recommended 2 month of med optimization prior to re-consideration.   She presents today to establish in the HF clinic. She is doing fairly well overall. Has felt slightly better on spiro than on lasix/potassium. Denies peripheral edema. No SOB walking around the house, but + with some inclines. Can take the stairs at pace. She has occasional lightheadedness when reaching above her head, but not marked or limiting. She has never had any chest pain, and does not have any family history of heart disease. She palpitations or pre-syncope. She has very occasional cough with lying flat, that usually resolves. She  does not snore, or have daytime fatigue.   Review of Systems: [y] = yes, [ ]  = no   General: Weight gain [ ] ; Weight loss [ ] ; Anorexia [ ] ; Fatigue [ ] ; Fever [ ] ; Chills [ ] ; Weakness [ ]   Cardiac: Chest pain/pressure [ ] ; Resting SOB [ ] ; Exertional SOB [y]; Orthopnea [?]; Pedal Edema [ ] ; Palpitations [ ] ; Syncope [ ] ; Presyncope [ ] ; Paroxysmal nocturnal dyspnea[ ]   Pulmonary: Cough [ ] ; Wheezing[ ] ; Hemoptysis[ ] ; Sputum [ ] ; Snoring [ ]   GI: Vomiting[ ] ; Dysphagia[ ] ; Melena[ ] ; Hematochezia [ ] ; Heartburn[ ] ; Abdominal pain [ ] ; Constipation [ ] ; Diarrhea [ ] ; BRBPR [ ]   GU: Hematuria[ ] ; Dysuria [ ] ; Nocturia[ ]   Vascular: Pain in legs with walking [ ] ; Pain in feet with lying flat [ ] ; Non-healing sores [ ] ; Stroke [ ] ; TIA [ ] ; Slurred speech [ ] ;  Neuro: Headaches[ ] ; Vertigo[ ] ; Seizures[ ] ; Paresthesias[ ] ;Blurred vision [ ] ; Diplopia [ ] ; Vision changes [ ]   Ortho/Skin: Arthritis [y]; Joint pain [y]; Muscle pain [ ] ; Joint swelling [ ] ; Back Pain [ ] ; Rash [ ]   Psych: Depression[ ] ; Anxiety[ ]   Heme: Bleeding problems [ ] ; Clotting disorders [ ] ; Anemia [ ]   Endocrine: Diabetes [ ] ; Thyroid dysfunction[ ]    Past Medical History:  Diagnosis Date  . Atrial tachycardia ([place])   . Cancer ([place]) 2008   LEFT Breast-Ductal CA in situ  . Fibroid   . GERD (gastroesophageal reflux disease)   . Hypertension   . LBBB (left bundle  branch block)   . Mitral regurgitation   . NICM (nonischemic cardiomyopathy) ([place])    a. dx 2011, EF 25-30% ? tachy induced. b. EF 40% in 2014. c. EF 30-35% in 01/2018, grade 1 DD.  . Osteopenia     Current Outpatient Medications  Medication Sig Dispense Refill  . aspirin 81 MG tablet Take 81 mg by mouth daily.    Marland Kitchen atorvastatin (LIPITOR) 10 MG tablet Take 10 mg by mouth daily.    . benazepril (LOTENSIN) 20 MG tablet Take 1 tablet (20 mg total) by mouth daily. 90 tablet 3  . Calcium Carbonate-Vitamin D (CALCIUM + D PO) Take 1 tablet by mouth 2 (two)  times daily.     . carvedilol (COREG) 12.5 MG tablet Take 12.5 mg by mouth 2 (two) times daily with a meal.    . Cholecalciferol (VITAMIN D PO) Take 1,000 Units by mouth daily.     . dorzolamide (TRUSOPT) 2 % ophthalmic solution Place 1 drop into the right eye 2 (two) times daily.    . fexofenadine (ALLEGRA) 180 MG tablet Take 180 mg by mouth daily as needed for allergies or rhinitis.    . Multiple Vitamin (MULTIVITAMIN) tablet Take 1 tablet by mouth daily.    Marland Kitchen spironolactone (ALDACTONE) 25 MG tablet Take 0.5 tablets (12.5 mg total) by mouth daily. 7 tablet 0  . mometasone (NASONEX) 50 MCG/ACT nasal spray Place 2 sprays into the nose daily as needed.     No current facility-administered medications for this encounter.     No Known Allergies    Social History   Socioeconomic History  . Marital status: Married    Spouse name: Not on file  . Number of children: Not on file  . Years of education: Not on file  . Highest education level: Not on file  Occupational History  . Not on file  Social Needs  . Financial resource strain: Not on file  . Food insecurity:    Worry: Not on file    Inability: Not on file  . Transportation needs:    Medical: Not on file    Non-medical: Not on file  Tobacco Use  . Smoking status: Former Smoker    Last attempt to quit: 10/27/1981    Years since quitting: 36.4  . Smokeless tobacco: Never Used  Substance and Sexual Activity  . Alcohol use: No  . Drug use: No  . Sexual activity: Never    Birth control/protection: Surgical, Post-menopausal  Lifestyle  . Physical activity:    Days per week: Not on file    Minutes per session: Not on file  . Stress: Not on file  Relationships  . Social connections:    Talks on phone: Not on file    Gets together: Not on file    Attends religious service: Not on file    Active member of club or organization: Not on file    Attends meetings of clubs or organizations: Not on file    Relationship status: Not on  file  . Intimate partner violence:    Fear of current or ex partner: Not on file    Emotionally abused: Not on file    Physically abused: Not on file    Forced sexual activity: Not on file  Other Topics Concern  . Not on file  Social History Narrative  . Not on file      Family History  Problem Relation Age of Onset  . Lung cancer Mother   .  Cancer Mother        LUNG-   . Breast cancer Maternal Aunt   . Diabetes Other   . Cancer Brother        PROSTATE  . Heart Problems Father   . Hypertension Brother   . Heart attack Neg Hx   . Stroke Neg Hx     Vitals:   04/15/18 0849  BP: (!) 142/72  Pulse: 73  SpO2: 100%  Weight: 72.6 kg (160 lb)  Height: 5\' 5"  (1.651 m)    Wt Readings from Last 3 Encounters:  04/15/18 72.6 kg (160 lb)  03/08/18 73.2 kg (161 lb 6.4 oz)  12/22/17 72.2 kg (159 lb 1.9 oz)   PHYSICAL EXAM: General:  Well appearing. No respiratory difficulty HEENT: normal Neck: supple. no JVD. Carotids 2+ bilat; no bruits. No lymphadenopathy or thyromegaly appreciated. Cor: PMI nondisplaced. Regular rate & rhythm. No rubs, gallops or murmurs. Lungs: clear Abdomen: soft, nontender, nondistended. No hepatosplenomegaly. No bruits or masses. Good bowel sounds. Extremities: no cyanosis, clubbing, rash, edema Neuro: alert & oriented x 3, cranial nerves grossly intact. moves all 4 extremities w/o difficulty. Affect pleasant.  ECG: NSR 70 bpm, PR 178 ms, QRS 128 ms, personally reviewed.  ASSESSMENT & PLAN:  1. Chronic systolic CHF - Echo 11/09/61 showed LVEF 30-35%, Grade 1 DD, Mild MR, Normal LA size, Mild VR dilation.  - Normal stress test in 2011, presumed NICM CMP. There is some question if due to LBBB vs long standing hypertension.  - NYHA II symptoms.  - Volume status stable on exam.   - Will order CPX test  - Continue lasix as needed - STOP benazapril. Start Entresto 49/51 mg BID after 36 hr wash out. (Evening of 04/16/18) - Continue spiro 12.5 mg daily.  BMET today and 10-14 days with change to Entresto  - Continue coreg 12.5 mg BID - Reinforced fluid restriction to < 2 L daily, sodium restriction to less than 2000 mg daily, and the importance of daily weights.   - With unclear etiology of CMP and worsened EF in past several years, will check cMRI prior to CRT-D consideration to investigate for ICM vs infiltrative CMP. If ICM suspected, will need L/RHC. Pt aware and agrees. - She does not snore or have daytime fatigue. Do not suspect OSA.   2. LBBB, Chronic - EKG today with QRS 128 ms. - Per Dr. Curt Bears, not candidate for CRT-D at this time with NYHA I symptoms and need for med optimization. Will follow up with plan after med optimization and repeat Echo.  3. H/o Atrial Tachycardia - Quiescent on BB. EKG as above.   4. HTN - Will adjust meds in setting of treating her CHF as above.  Doing well overall with NYHA II symptoms. With chronically depressed EF, will check cMRI to look for ischemia vs infiltrative CMP. Will also order CPX to assess cardiac reserve. Will continue med optimization as above. Return to app clinic in 4-6 weeks (works best for patient due to holidays) and will plan on Echo in 3 months after med optimization. Pt aware that plan may vary based on results of cMRI.   Shirley Friar, PA-C 04/15/18   Patient seen and examined with the above-signed Advanced Practice Provider and/or Housestaff. I personally reviewed laboratory data, imaging studies and relevant notes. I independently examined the patient and formulated the important aspects of the plan. I have edited the note to reflect any of my changes or salient points. I  have personally discussed the plan with the patient and/or family.  77 y/o woman HTN, GERD, atrial tach, LBBB and systolic HF due to presumed NICM with EF 30-35% referred by Melina Copa, PA-C.  Currently doing well. NYHA II. Volume status looks good. Has not had cath or cMRI yet. LBBB < 131ms.   On exam  JVP flat. No s3 Lungs clear Extremities warm no edema   Although LBBB not that wide I suspect she may have LBBBB cardiomyopathy but need to exclude other processes such as CAD, HTN or infiltrative disorders.  Will start with cMRI. Switch ACE-I to [facility]. Will eventually need cath.    Glori Bickers, MD  7:12 PM

## 2018-04-15 ENCOUNTER — Ambulatory Visit (HOSPITAL_COMMUNITY)
Admission: RE | Admit: 2018-04-15 | Discharge: 2018-04-15 | Disposition: A | Discharge status: Home / Self Care | Payer: MEDICARE | Source: Ambulatory Visit | Attending: Cardiology | Admitting: Cardiology

## 2018-04-15 ENCOUNTER — Encounter (HOSPITAL_COMMUNITY): Payer: Self-pay

## 2018-04-15 VITALS — BP 142/72 | HR 73 | Ht 65.0 in | Wt 160.0 lb

## 2018-04-15 DIAGNOSIS — Z79899 Other long term (current) drug therapy: Secondary | ICD-10-CM | POA: Insufficient documentation

## 2018-04-15 DIAGNOSIS — I10 Essential (primary) hypertension: Secondary | ICD-10-CM | POA: Diagnosis not present

## 2018-04-15 DIAGNOSIS — I428 Other cardiomyopathies: Secondary | ICD-10-CM | POA: Insufficient documentation

## 2018-04-15 DIAGNOSIS — M8580 Other specified disorders of bone density and structure, unspecified site: Secondary | ICD-10-CM | POA: Insufficient documentation

## 2018-04-15 DIAGNOSIS — Z853 Personal history of malignant neoplasm of breast: Secondary | ICD-10-CM | POA: Diagnosis not present

## 2018-04-15 DIAGNOSIS — Z7982 Long term (current) use of aspirin: Secondary | ICD-10-CM | POA: Diagnosis not present

## 2018-04-15 DIAGNOSIS — I110 Hypertensive heart disease with heart failure: Secondary | ICD-10-CM | POA: Diagnosis not present

## 2018-04-15 DIAGNOSIS — I447 Left bundle-branch block, unspecified: Secondary | ICD-10-CM | POA: Insufficient documentation

## 2018-04-15 DIAGNOSIS — I471 Supraventricular tachycardia: Secondary | ICD-10-CM | POA: Insufficient documentation

## 2018-04-15 DIAGNOSIS — D259 Leiomyoma of uterus, unspecified: Secondary | ICD-10-CM | POA: Insufficient documentation

## 2018-04-15 DIAGNOSIS — K219 Gastro-esophageal reflux disease without esophagitis: Secondary | ICD-10-CM | POA: Diagnosis not present

## 2018-04-15 DIAGNOSIS — Z87891 Personal history of nicotine dependence: Secondary | ICD-10-CM | POA: Diagnosis not present

## 2018-04-15 DIAGNOSIS — I5022 Chronic systolic (congestive) heart failure: Secondary | ICD-10-CM | POA: Diagnosis present

## 2018-04-15 DIAGNOSIS — Z803 Family history of malignant neoplasm of breast: Secondary | ICD-10-CM | POA: Diagnosis not present

## 2018-04-15 DIAGNOSIS — Z8679 Personal history of other diseases of the circulatory system: Secondary | ICD-10-CM

## 2018-04-15 DIAGNOSIS — Z8249 Family history of ischemic heart disease and other diseases of the circulatory system: Secondary | ICD-10-CM | POA: Insufficient documentation

## 2018-04-15 LAB — BASIC METABOLIC PANEL
Anion gap: 9 (ref 5–15)
BUN: 16 mg/dL (ref 8–23)
CO2: 21 mmol/L — ABNORMAL LOW (ref 22–32)
Calcium: 9.6 mg/dL (ref 8.9–10.3)
Chloride: 110 mmol/L (ref 98–111)
Creatinine, Ser: 1.13 mg/dL — ABNORMAL HIGH (ref 0.44–1.00)
GFR calc Af Amer: 55 mL/min — ABNORMAL LOW (ref >60)
GFR calc non Af Amer: 47 mL/min — ABNORMAL LOW (ref >60)
Glucose, Bld: 97 mg/dL (ref 70–99)
POTASSIUM: 4.6 mmol/L (ref 3.5–5.1)
SODIUM: 140 mmol/L (ref 135–145)

## 2018-04-15 LAB — BRAIN NATRIURETIC PEPTIDE: B NATRIURETIC PEPTIDE 5: 64 pg/mL (ref 0.0–100.0)

## 2018-04-15 MED ORDER — SACUBITRIL-VALSARTAN 49-51 MG PO TABS: 1.0000 | ORAL_TABLET | Freq: Two times a day (BID) | ORAL | 6 refills | Status: DC

## 2018-04-15 NOTE — Patient Instructions (Addendum)
STOP Benazepril START Entresto 49/51 mg, one tab twice a day with the first dose starting 12/7 in the evening  Labs today We will only contact you if something comes back abnormal or we need to make some changes. Otherwise no news is good news!  Labs needed in 10-14 days  Your physician has recommended that you have a cardiopulmonary stress test (CPX). CPX testing is a non-invasive measurement of heart and lung function. It replaces a traditional treadmill stress test. This type of test provides a tremendous amount of information that relates not only to your present condition but also for future outcomes. This test combines measurements of you ventilation, respiratory gas exchange in the lungs, electrocardiogram (EKG), blood pressure and physical response before, during, and following an exercise protocol.  Your physician has requested that you have a cardiac MRI. Cardiac MRI uses a computer to create images of your heart as its beating, producing both still and moving pictures of your heart and major blood vessels. For further information please visit [email or web address] Please follow the instruction sheet given to you today for more information.  Your physician recommends that you schedule a follow-up appointment in: 4-6 weeks  in the Advanced Practitioners (PA/NP) Clinic   Your physician recommends that you schedule a follow-up appointment in: 3 months with Dr Haroldine Laws and echo  Your physician has requested that you have an echocardiogram. Echocardiography is a painless test that uses sound waves to create images of your heart. It provides your doctor with information about the size and shape of your heart and how well your heart's chambers and valves are working. This procedure takes approximately one hour. There are no restrictions for this procedure.   Do the following things EVERYDAY: 1) Weigh yourself in the morning before breakfast. Write it down and keep it in a log. 2) Take your  medicines as prescribed 3) Eat low salt foods-Limit salt (sodium) to 2000 mg per day.  4) Stay as active as you can everyday 5) Limit all fluids for the day to less than 2 liters   At the [facility], you and your health needs are our priority. As part of our continuing mission to provide you with exceptional heart care, we have created designated Provider Care Teams. These Care Teams include your primary Cardiologist (physician) and Advanced Practice Providers (APPs- Physician Assistants and Nurse Practitioners) who all work together to provide you with the care you need, when you need it.   You may see any of the following providers on your designated Care Team at your next follow up: Marland Kitchen Dr Glori Bickers . Dr Loralie Champagne . Darrick Grinder, NP . Lillia Mountain, NP . Rebecca Eaton

## 2018-04-21 ENCOUNTER — Encounter: Payer: Self-pay | Admitting: *Deleted

## 2018-04-25 ENCOUNTER — Encounter (HOSPITAL_COMMUNITY): Payer: PRIVATE HEALTH INSURANCE

## 2018-04-28 ENCOUNTER — Telehealth (HOSPITAL_COMMUNITY): Payer: Self-pay | Admitting: Emergency Medicine

## 2018-04-28 NOTE — Telephone Encounter (Signed)
Reaching out to patient to offer assistance regarding upcoming cardiac imaging study; pt verbalizes understanding of appt date/time, parking situation and where to check in, pre-test NPO status and medications ordered, and verified current allergies; name and call back number provided for further questions should they arise Will Heinkel RN Navigator Cardiac Imaging [phone] 

## 2018-04-28 NOTE — Telephone Encounter (Signed)
Reaching out to patient to offer assistance regarding upcoming cardiac imaging study; unable to get in touch with patient but left a voicemail on answering machine; name and call back number provided for further questions should they arise Marchia Bond RN Navigator Cardiac Imaging [phone]

## 2018-04-29 ENCOUNTER — Ambulatory Visit (HOSPITAL_COMMUNITY)
Admission: RE | Admit: 2018-04-29 | Discharge: 2018-04-29 | Disposition: A | Discharge status: Home / Self Care | Payer: MEDICARE | Source: Ambulatory Visit | Attending: Internal Medicine | Admitting: Internal Medicine

## 2018-04-29 ENCOUNTER — Other Ambulatory Visit (HOSPITAL_COMMUNITY): Payer: PRIVATE HEALTH INSURANCE

## 2018-04-29 ENCOUNTER — Ambulatory Visit (HOSPITAL_COMMUNITY): Payer: MEDICARE

## 2018-04-29 DIAGNOSIS — I428 Other cardiomyopathies: Secondary | ICD-10-CM | POA: Diagnosis not present

## 2018-04-29 LAB — BASIC METABOLIC PANEL
Anion gap: 7 (ref 5–15)
BUN: 16 mg/dL (ref 8–23)
CHLORIDE: 108 mmol/L (ref 98–111)
CO2: 27 mmol/L (ref 22–32)
CREATININE: 1.14 mg/dL — AB (ref 0.44–1.00)
Calcium: 9.4 mg/dL (ref 8.9–10.3)
GFR calc Af Amer: 54 mL/min — ABNORMAL LOW (ref >60)
GFR calc non Af Amer: 47 mL/min — ABNORMAL LOW (ref >60)
Glucose, Bld: 111 mg/dL — ABNORMAL HIGH (ref 70–99)
Potassium: 4.5 mmol/L (ref 3.5–5.1)
SODIUM: 142 mmol/L (ref 135–145)

## 2018-05-02 ENCOUNTER — Ambulatory Visit (HOSPITAL_COMMUNITY)
Admission: RE | Admit: 2018-05-02 | Discharge: 2018-05-02 | Disposition: A | Discharge status: Home / Self Care | Payer: MEDICARE | Source: Ambulatory Visit | Attending: Student | Admitting: Student

## 2018-05-02 ENCOUNTER — Telehealth (HOSPITAL_COMMUNITY): Payer: Self-pay

## 2018-05-02 DIAGNOSIS — I340 Nonrheumatic mitral (valve) insufficiency: Secondary | ICD-10-CM | POA: Diagnosis not present

## 2018-05-02 DIAGNOSIS — I517 Cardiomegaly: Secondary | ICD-10-CM | POA: Diagnosis not present

## 2018-05-02 DIAGNOSIS — I428 Other cardiomyopathies: Secondary | ICD-10-CM | POA: Diagnosis present

## 2018-05-02 MED ORDER — GADOBUTROL 1 MMOL/ML IV SOLN
7.5000 mL | Freq: Once | INTRAVENOUS | Status: AC | PRN
  Administered 2018-05-02: 7.5 mL via INTRAVENOUS

## 2018-05-02 NOTE — Telephone Encounter (Signed)
-----   Message from Shirley Friar, PA-C sent at 05/02/2018  7:16 AM EST -----  Please let her know her CPX was normal! No limitation from her heart or lungs was noted.    Legrand Como [address] [place], PA-C 05/02/2018 7:16 AM

## 2018-05-02 NOTE — Telephone Encounter (Signed)
Spoke to pt about normal CPX results. Pt aware, agreeable and verbalizes understanding.

## 2018-05-13 ENCOUNTER — Other Ambulatory Visit (HOSPITAL_COMMUNITY): Payer: Self-pay

## 2018-05-13 MED ORDER — SACUBITRIL-VALSARTAN 49-51 MG PO TABS: 1.0000 | ORAL_TABLET | Freq: Two times a day (BID) | ORAL | 6 refills | Status: DC

## 2018-05-27 ENCOUNTER — Ambulatory Visit (HOSPITAL_COMMUNITY)
Admission: RE | Admit: 2018-05-27 | Discharge: 2018-05-27 | Disposition: A | Discharge status: Home / Self Care | Payer: MEDICARE | Source: Ambulatory Visit | Attending: Internal Medicine | Admitting: Internal Medicine

## 2018-05-27 ENCOUNTER — Encounter (HOSPITAL_COMMUNITY): Payer: Self-pay

## 2018-05-27 VITALS — BP 160/88 | HR 70 | Wt 160.4 lb

## 2018-05-27 DIAGNOSIS — M8580 Other specified disorders of bone density and structure, unspecified site: Secondary | ICD-10-CM | POA: Insufficient documentation

## 2018-05-27 DIAGNOSIS — Z8042 Family history of malignant neoplasm of prostate: Secondary | ICD-10-CM | POA: Insufficient documentation

## 2018-05-27 DIAGNOSIS — Z801 Family history of malignant neoplasm of trachea, bronchus and lung: Secondary | ICD-10-CM | POA: Insufficient documentation

## 2018-05-27 DIAGNOSIS — Z9010 Acquired absence of unspecified breast and nipple: Secondary | ICD-10-CM | POA: Insufficient documentation

## 2018-05-27 DIAGNOSIS — Z853 Personal history of malignant neoplasm of breast: Secondary | ICD-10-CM | POA: Diagnosis not present

## 2018-05-27 DIAGNOSIS — I447 Left bundle-branch block, unspecified: Secondary | ICD-10-CM | POA: Diagnosis not present

## 2018-05-27 DIAGNOSIS — I428 Other cardiomyopathies: Secondary | ICD-10-CM | POA: Diagnosis not present

## 2018-05-27 DIAGNOSIS — Z7982 Long term (current) use of aspirin: Secondary | ICD-10-CM | POA: Diagnosis not present

## 2018-05-27 DIAGNOSIS — Z833 Family history of diabetes mellitus: Secondary | ICD-10-CM | POA: Diagnosis not present

## 2018-05-27 DIAGNOSIS — Z8249 Family history of ischemic heart disease and other diseases of the circulatory system: Secondary | ICD-10-CM | POA: Insufficient documentation

## 2018-05-27 DIAGNOSIS — Z87891 Personal history of nicotine dependence: Secondary | ICD-10-CM | POA: Insufficient documentation

## 2018-05-27 DIAGNOSIS — Z79899 Other long term (current) drug therapy: Secondary | ICD-10-CM | POA: Insufficient documentation

## 2018-05-27 DIAGNOSIS — I5022 Chronic systolic (congestive) heart failure: Secondary | ICD-10-CM | POA: Diagnosis present

## 2018-05-27 DIAGNOSIS — I110 Hypertensive heart disease with heart failure: Secondary | ICD-10-CM | POA: Insufficient documentation

## 2018-05-27 DIAGNOSIS — I471 Supraventricular tachycardia: Secondary | ICD-10-CM | POA: Insufficient documentation

## 2018-05-27 NOTE — Patient Instructions (Signed)
Your physician wants you to follow-up in 6 MONTHS You will receive a reminder letter in the mail two months in advance. If you don't receive a letter, please call our office to schedule the follow-up appointment.   HAPPY Friday!

## 2018-05-27 NOTE — Progress Notes (Signed)
[facility]    PCP: Seward Carol, MD PCP-Cardiologist: Larae Grooms, MD   HPI: Jacqueline Murphy is a 78 y.o. female with h/o Chronic systolic CHF, fibroids, breast CA (tx with mastectomy only), HTN, GERD, Atrial Tach, MR, and LBBB referred by Melina Copa, PA-C for further evaluation of HF.   Last seen in [facility] clinic 04/08/18 to follow up on Echo results as above. Denied new cardiac symptoms. Insurance had changed ramipril to benazepril. BP elevated in clinic at 168/78 despite taking her medications that am. Melina Copa, PA-C discussed with Dr. Haroldine Laws, who recommended pt be seen in the CHF clinic for med titration and CPX with reported NYHA I-II symptoms. Case also reviewed by Dr. Curt Bears, and not candidate for ICD/CRT-D with NYHA I symptoms and lack of medical optimization.  Today she returns for HF follow up. Last visit Ace stopped and ARNi started. Overall feeling fine. Denies SOB/PND/Orthopnea. SBP at home 10-120. Walking 45 minutes at day at the mall. Appetite ok. No fever or chills. Weight at home  pounds. Taking all medications.\  Cardiac Testing.   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  Mild LAE  Echo 02/04/18 showed LVEF 30-35%, Grade 1 DD, Mild MR, Normal LA size, Mild VR dilation Echo 07/2012 showed EF 40%, mild LVH, moderate asymmetric septal hypertrophy, mild MR. Echo 2008 showed EF 55-60% Echo 02/2010 showed fall in EF to 20-25% with moderate-severe MR. She had an atrial tachycardia diagnosed at the time felt possibly cause of her cardiomyopathy. No prior cath.  Echo 07/2012 showed EF 40%, mild LVH, moderate asymmetric septal hypertrophy, mild MR.  Nuclear stress test 04/2010 showed no evidence of ischemia, EF 33%.   CPX 04/29/2018- normal CPX  Peak VO2: 18.0 (103% predicted peak  VO2) VE/VCO2 slope: 32 OUES: 1.48 Peak RER: 1.12    Past Medical History:  Diagnosis Date  . Atrial tachycardia ([place])   . Cancer ([place]) 2008   LEFT Breast-Ductal CA in situ  . Fibroid   . GERD (gastroesophageal reflux disease)   . Hypertension   . LBBB (left bundle branch block)   . Mitral regurgitation   . NICM (nonischemic cardiomyopathy) ([place])    a. dx 2011, EF 25-30% ? tachy induced. b. EF 40% in 2014. c. EF 30-35% in 01/2018, grade 1 DD.  . Osteopenia     Current Outpatient Medications  Medication Sig Dispense Refill  . aspirin 81 MG tablet Take 81 mg by mouth daily.    Marland Kitchen atorvastatin (LIPITOR) 10 MG tablet Take 10 mg by mouth daily.    . Calcium Carbonate-Vitamin D (CALCIUM + D PO) Take 1 tablet by mouth 2 (two) times daily.     . carvedilol (COREG) 12.5 MG tablet Take 12.5 mg by mouth 2 (two) times daily with a meal.    . Cholecalciferol (VITAMIN D PO) Take 1,000 Units by mouth daily.     . dorzolamide (TRUSOPT) 2 % ophthalmic solution Place 1 drop into the right eye 2 (two) times daily.    . fexofenadine (ALLEGRA) 180 MG tablet Take 180 mg by mouth daily as needed for allergies or rhinitis.    . mometasone (NASONEX) 50 MCG/ACT nasal spray Place 2 sprays into the nose daily as needed.    . Multiple Vitamin (MULTIVITAMIN) tablet Take 1 tablet by mouth daily.    . sacubitril-valsartan (ENTRESTO)  49-51 MG Take 1 tablet by mouth 2 (two) times daily. 60 tablet 6  . spironolactone (ALDACTONE) 25 MG tablet Take 0.5 tablets (12.5 mg total) by mouth daily. 7 tablet 0   No current facility-administered medications for this encounter.     No Known Allergies    Social History   Socioeconomic History  . Marital status: Married    Spouse name: Not on file  . Number of children: Not on file  . Years of education: Not on file  . Highest education level: Not on file  Occupational History  . Not on file  Social Needs  . Financial resource strain: Not on file  . Food  insecurity:    Worry: Not on file    Inability: Not on file  . Transportation needs:    Medical: Not on file    Non-medical: Not on file  Tobacco Use  . Smoking status: Former Smoker    Last attempt to quit: 10/27/1981    Years since quitting: 36.6  . Smokeless tobacco: Never Used  Substance and Sexual Activity  . Alcohol use: No  . Drug use: No  . Sexual activity: Never    Birth control/protection: Surgical, Post-menopausal  Lifestyle  . Physical activity:    Days per week: Not on file    Minutes per session: Not on file  . Stress: Not on file  Relationships  . Social connections:    Talks on phone: Not on file    Gets together: Not on file    Attends religious service: Not on file    Active member of club or organization: Not on file    Attends meetings of clubs or organizations: Not on file    Relationship status: Not on file  . Intimate partner violence:    Fear of current or ex partner: Not on file    Emotionally abused: Not on file    Physically abused: Not on file    Forced sexual activity: Not on file  Other Topics Concern  . Not on file  Social History Narrative  . Not on file      Family History  Problem Relation Age of Onset  . Lung cancer Mother   . Cancer Mother        LUNG-   . Breast cancer Maternal Aunt   . Diabetes Other   . Cancer Brother        PROSTATE  . Heart Problems Father   . Hypertension Brother   . Heart attack Neg Hx   . Stroke Neg Hx     Vitals:   05/27/18 1003  BP: (!) 160/88  Pulse: 70  SpO2: 100%  Weight: 72.8 kg (160 lb 6.4 oz)    Wt Readings from Last 3 Encounters:  05/27/18 72.8 kg (160 lb 6.4 oz)  04/15/18 72.6 kg (160 lb)  03/08/18 73.2 kg (161 lb 6.4 oz)   PHYSICAL EXAM: General:  Well appearing. No resp difficulty HEENT: normal Neck: supple. no JVD. Carotids 2+ bilat; no bruits. No lymphadenopathy or thryomegaly appreciated. Cor: PMI nondisplaced. Regular rate & rhythm. No rubs, gallops or murmurs. Lungs:  clear Abdomen: soft, nontender, nondistended. No hepatosplenomegaly. No bruits or masses. Good bowel sounds. Extremities: no cyanosis, clubbing, rash, edema Neuro: alert & orientedx3, cranial nerves grossly intact. moves all 4 extremities w/o difficulty. Affect pleasant   ASSESSMENT & PLAN: 1. Chronic systolic CHF - Echo 01/31/29 showed LVEF 30-35%, Grade 1 DD, Mild MR, Normal LA size, Mild  VR dilation.  - Normal stress test in 2011, presumed NICM CMP. There is some question if due to LBBB vs long standing hypertension.   -CPX test 12/20 was normal.  -CMRI completed 12/23 showed LVEF 44% and noncompaction noted. No role for anticoagulation with EF > 35% and no arrhythmias. Dr Haroldine Laws reviewed and discussed CMRI. We will need to refer her family for genetic testing.  NYHA I.  Continue lasix as needed - Entresto 49/51 mg BID  - Continue spiro 12.5 mg daily.  - Continue coreg 12.5 mg BID.    2. LBBB, Chronic - Per Dr. Curt Bears, not candidate for CRT-D .  CMRI with EF 44% out of range for a device.    3. H/o Atrial Tachycardia - Quiescent on BB.    4. HTN -Stable at home.   Refer to Dr Broadus John for cardiac screening. Follow up with Dr Haroldine Laws only in 3 months with an ECHO.   Darrick Grinder, NP 05/27/18   Patient seen and examined with Darrick Grinder, NP. We discussed all aspects of the encounter. I agree with the assessment and plan as stated above.   78 y/o woman with chronic systolic HF due to NICM. Felt to be related to HTN +/- LBBB. Previous EF 30-35%. We have been adjusting meds and patient feeling much better. Now NYHA I-II. Volume status looks good. No s3 on exam. 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 I discussed this with her at length and the implications of it. We will refer to Dr. Broadus John for genetics evaluation and family testing/screening. With EF > 35% no indication for ICD currently.   We will continue to follow. Await results of genetics eval  before imaging 1st degree relatives.   Glori Bickers, MD  5:51 PM

## 2018-07-18 ENCOUNTER — Encounter (HOSPITAL_COMMUNITY): Payer: PRIVATE HEALTH INSURANCE | Admitting: Internal Medicine

## 2018-07-18 ENCOUNTER — Other Ambulatory Visit (HOSPITAL_COMMUNITY): Payer: PRIVATE HEALTH INSURANCE

## 2018-07-27 ENCOUNTER — Encounter (HOSPITAL_COMMUNITY): Payer: PRIVATE HEALTH INSURANCE | Admitting: Internal Medicine

## 2018-07-27 ENCOUNTER — Other Ambulatory Visit (HOSPITAL_COMMUNITY): Payer: PRIVATE HEALTH INSURANCE

## 2018-10-10 ENCOUNTER — Ambulatory Visit (HOSPITAL_COMMUNITY): Payer: MEDICARE

## 2018-10-10 ENCOUNTER — Encounter (HOSPITAL_COMMUNITY): Payer: PRIVATE HEALTH INSURANCE | Admitting: Internal Medicine

## 2019-01-30 ENCOUNTER — Encounter (HOSPITAL_COMMUNITY): Payer: Self-pay | Admitting: Internal Medicine

## 2019-01-30 ENCOUNTER — Ambulatory Visit (HOSPITAL_BASED_OUTPATIENT_CLINIC_OR_DEPARTMENT_OTHER)
Admission: RE | Admit: 2019-01-30 | Discharge: 2019-01-30 | Disposition: A | Discharge status: Home / Self Care | Payer: MEDICARE | Source: Ambulatory Visit | Attending: Student | Admitting: Student

## 2019-01-30 ENCOUNTER — Other Ambulatory Visit: Payer: Self-pay

## 2019-01-30 ENCOUNTER — Ambulatory Visit (HOSPITAL_COMMUNITY)
Admission: RE | Admit: 2019-01-30 | Discharge: 2019-01-30 | Disposition: A | Discharge status: Home / Self Care | Payer: MEDICARE | Source: Ambulatory Visit | Attending: Internal Medicine | Admitting: Internal Medicine

## 2019-01-30 VITALS — BP 150/66 | HR 66 | Wt 167.0 lb

## 2019-01-30 DIAGNOSIS — I10 Essential (primary) hypertension: Secondary | ICD-10-CM

## 2019-01-30 DIAGNOSIS — I110 Hypertensive heart disease with heart failure: Secondary | ICD-10-CM | POA: Diagnosis not present

## 2019-01-30 DIAGNOSIS — Z8249 Family history of ischemic heart disease and other diseases of the circulatory system: Secondary | ICD-10-CM | POA: Insufficient documentation

## 2019-01-30 DIAGNOSIS — M8580 Other specified disorders of bone density and structure, unspecified site: Secondary | ICD-10-CM | POA: Insufficient documentation

## 2019-01-30 DIAGNOSIS — Z87891 Personal history of nicotine dependence: Secondary | ICD-10-CM | POA: Insufficient documentation

## 2019-01-30 DIAGNOSIS — I447 Left bundle-branch block, unspecified: Secondary | ICD-10-CM | POA: Insufficient documentation

## 2019-01-30 DIAGNOSIS — Z7982 Long term (current) use of aspirin: Secondary | ICD-10-CM | POA: Insufficient documentation

## 2019-01-30 DIAGNOSIS — I428 Other cardiomyopathies: Secondary | ICD-10-CM | POA: Diagnosis not present

## 2019-01-30 DIAGNOSIS — Z79899 Other long term (current) drug therapy: Secondary | ICD-10-CM | POA: Diagnosis not present

## 2019-01-30 DIAGNOSIS — I5022 Chronic systolic (congestive) heart failure: Secondary | ICD-10-CM | POA: Diagnosis not present

## 2019-01-30 DIAGNOSIS — I083 Combined rheumatic disorders of mitral, aortic and tricuspid valves: Secondary | ICD-10-CM | POA: Insufficient documentation

## 2019-01-30 DIAGNOSIS — I959 Hypotension, unspecified: Secondary | ICD-10-CM | POA: Insufficient documentation

## 2019-01-30 LAB — BASIC METABOLIC PANEL
Anion gap: 10 (ref 5–15)
BUN: 16 mg/dL (ref 8–23)
CO2: 20 mmol/L — ABNORMAL LOW (ref 22–32)
Calcium: 9.6 mg/dL (ref 8.9–10.3)
Chloride: 111 mmol/L (ref 98–111)
Creatinine, Ser: 1.08 mg/dL — ABNORMAL HIGH (ref 0.44–1.00)
GFR calc Af Amer: 57 mL/min — ABNORMAL LOW (ref >60)
GFR calc non Af Amer: 49 mL/min — ABNORMAL LOW (ref >60)
Glucose, Bld: 97 mg/dL (ref 70–99)
Potassium: 5.7 mmol/L — ABNORMAL HIGH (ref 3.5–5.1)
Sodium: 141 mmol/L (ref 135–145)

## 2019-01-30 LAB — BRAIN NATRIURETIC PEPTIDE: B Natriuretic Peptide: 98.7 pg/mL (ref 0.0–100.0)

## 2019-01-30 LAB — CBC
HCT: 38.7 % (ref 36.0–46.0)
Hemoglobin: 12.3 g/dL (ref 12.0–15.0)
MCH: 28.6 pg (ref 26.0–34.0)
MCHC: 31.8 g/dL (ref 30.0–36.0)
MCV: 90 fL (ref 80.0–100.0)
Platelets: 228 10*3/uL (ref 150–400)
RBC: 4.3 MIL/uL (ref 3.87–5.11)
RDW: 15.2 % (ref 11.5–15.5)
WBC: 7.1 10*3/uL (ref 4.0–10.5)
nRBC: 0 % (ref 0.0–0.2)

## 2019-01-30 NOTE — Progress Notes (Signed)
Advanced [facility] Failure Clinic    PCP: Seward Carol, MD PCP-Cardiologist: Larae Grooms, MD   HPI: Jacqueline Murphy is a 78 y.o. female with h/o Chronic systolic CHF, fibroids, breast CA (tx with mastectomy only), HTN, GERD, Atrial Tach, MR, and LBBB referred by Melina Copa, PA-C for further evaluation of HF.   Last seen in [facility] clinic 04/08/18 to follow up on Echo results as above. Denied new cardiac symptoms. Insurance had changed ramipril to benazepril. BP elevated in clinic at 168/78 despite taking her medications that am. Melina Copa, PA-C discussed with Dr. Haroldine Laws, who recommended pt be seen in the CHF clinic for med titration and CPX with reported NYHA I-II symptoms. Case also reviewed by Dr. Curt Bears, and not candidate for ICD/CRT-D with NYHA I symptoms and lack of medical optimization.  Today she returns for HF follow up. Says she feels great. Doing all her activities without SOB. Walks a mile every day without problem. No CP, SOB, palpitations or edema. BP at home 125-135. Had several episodes of low BP with SBP 80-90 requiring her to get a drink and some chips.   Echo today EF 45-50% Grade I DD. Personally reviewed   Cardiac Testing.   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  Mild LAE  Echo 02/04/18 showed LVEF 30-35%, Grade 1 DD, Mild MR, Normal LA size, Mild VR dilation Echo 07/2012 showed EF 40%, mild LVH, moderate asymmetric septal hypertrophy, mild MR. Echo 2008 showed EF 55-60% Echo 02/2010 showed fall in EF to 20-25% with moderate-severe MR. She had an atrial tachycardia diagnosed at the time felt possibly cause of her cardiomyopathy. No prior cath.  Echo 07/2012 showed EF 40%, mild LVH, moderate asymmetric septal hypertrophy, mild MR.  Nuclear stress test 04/2010 showed no evidence of ischemia, EF 33%.    CPX 04/29/2018- normal CPX  Peak VO2: 18.0 (103% predicted peak VO2) VE/VCO2 slope: 32 OUES: 1.48 Peak RER: 1.12    Past Medical History:  Diagnosis Date  . Atrial tachycardia ([place])   . Cancer ([place]) 2008   LEFT Breast-Ductal CA in situ  . Fibroid   . GERD (gastroesophageal reflux disease)   . Hypertension   . LBBB (left bundle branch block)   . Mitral regurgitation   . NICM (nonischemic cardiomyopathy) ([place])    a. dx 2011, EF 25-30% ? tachy induced. b. EF 40% in 2014. c. EF 30-35% in 01/2018, grade 1 DD.  . Osteopenia     Current Outpatient Medications  Medication Sig Dispense Refill  . aspirin 81 MG tablet Take 81 mg by mouth daily.    Marland Kitchen atorvastatin (LIPITOR) 10 MG tablet Take 10 mg by mouth daily.    . Calcium Carbonate-Vitamin D (CALCIUM + D PO) Take 1 tablet by mouth 2 (two) times daily.     . carvedilol (COREG) 12.5 MG tablet Take 12.5 mg by mouth 2 (two) times daily with a meal.    . Cholecalciferol (VITAMIN D PO) Take 1,000 Units by mouth daily.     . dorzolamide (TRUSOPT) 2 % ophthalmic solution Place 1 drop into the right eye 2 (two) times daily.    . fexofenadine (ALLEGRA) 180 MG tablet Take 180 mg by mouth daily as needed for allergies or rhinitis.    . mometasone (NASONEX) 50 MCG/ACT nasal spray Place 2 sprays into the nose daily as needed.    Marland Kitchen  Multiple Vitamin (MULTIVITAMIN) tablet Take 1 tablet by mouth daily.    . sacubitril-valsartan (ENTRESTO) 49-51 MG Take 1 tablet by mouth 2 (two) times daily. 60 tablet 6  . spironolactone (ALDACTONE) 25 MG tablet Take 0.5 tablets (12.5 mg total) by mouth daily. 7 tablet 0   No current facility-administered medications for this encounter.     No Known Allergies    Social History   Socioeconomic History  . Marital status: Married    Spouse name: Not on file  . Number of children: Not on file  . Years of education: Not on file  . Highest education level: Not on file  Occupational History  . Not on file   Social Needs  . Financial resource strain: Not on file  . Food insecurity    Worry: Not on file    Inability: Not on file  . Transportation needs    Medical: Not on file    Non-medical: Not on file  Tobacco Use  . Smoking status: Former Smoker    Quit date: 10/27/1981    Years since quitting: 37.2  . Smokeless tobacco: Never Used  Substance and Sexual Activity  . Alcohol use: No  . Drug use: No  . Sexual activity: Never    Birth control/protection: Surgical, Post-menopausal  Lifestyle  . Physical activity    Days per week: Not on file    Minutes per session: Not on file  . Stress: Not on file  Relationships  . Social Herbalist on phone: Not on file    Gets together: Not on file    Attends religious service: Not on file    Active member of club or organization: Not on file    Attends meetings of clubs or organizations: Not on file    Relationship status: Not on file  . Intimate partner violence    Fear of current or ex partner: Not on file    Emotionally abused: Not on file    Physically abused: Not on file    Forced sexual activity: Not on file  Other Topics Concern  . Not on file  Social History Narrative  . Not on file      Family History  Problem Relation Age of Onset  . Lung cancer Mother   . Cancer Mother        LUNG-   . Breast cancer Maternal Aunt   . Diabetes Other   . Cancer Brother        PROSTATE  . Heart Problems Father   . Hypertension Brother   . Heart attack Neg Hx   . Stroke Neg Hx     Vitals:   01/30/19 1412  BP: (!) 150/66  Pulse: 66  SpO2: 98%  Weight: 75.8 kg (167 lb)    Wt Readings from Last 3 Encounters:  01/30/19 75.8 kg (167 lb)  05/27/18 72.8 kg (160 lb 6.4 oz)  04/15/18 72.6 kg (160 lb)   PHYSICAL EXAM: General:  Well appearing. No resp difficulty HEENT: normal Neck: supple. no JVD. Carotids 2+ bilat; no bruits. No lymphadenopathy or thryomegaly appreciated. Cor: PMI nondisplaced. Regular rate & rhythm. No  rubs, gallops or murmurs. Lungs: clear Abdomen: soft, nontender, nondistended. No hepatosplenomegaly. No bruits or masses. Good bowel sounds. Extremities: no cyanosis, clubbing, rash, edema Neuro: alert & orientedx3, cranial nerves grossly intact. moves all 4 extremities w/o difficulty. Affect pleasant   ASSESSMENT & PLAN: 1. Chronic systolic CHF - Echo 0000000 showed LVEF  30-35%, Grade 1 DD, Mild MR, Normal LA size, Mild VR dilation.  - Normal stress test in 2011, presumed NICM CMP. There is some question if due to LBBB vs long standing hypertension.  -CPX test 12/20 was normal.  -cMRI completed 12/23 showed LVEF 44% and noncompaction noted. No role for anticoagulation with EF > 35% and no arrhythmias. - Echo today 45-50% - NYHA I. Volume status looks good.  Delene Loll 49/51 mg BID will not increase with occasional episodes of symptomatic hypotension - Continue spiro 12.5 mg daily.  - Continue coreg 12.5 mg BID. - Stop ASA - We will refer back to Genetics Counseling to identify need for family screening. She has one son in [place], [place] who likely needs imaging.     2. LBBB, Chronic - Per Dr. Curt Bears, not candidate for CRT-D .  -CMRI with EF 44% out of range for a device.    3. H/o Atrial Tachycardia - Quiescent on BB.    4. HTN - BP mildly elevated her but well controlled at home and even some periods of low BP. Continue to follow closely.    Glori Bickers, MD 01/30/19

## 2019-01-30 NOTE — Patient Instructions (Signed)
STOP ASPIRIN  Labs today We will only contact you if something comes back abnormal or we need to make some changes. Otherwise no news is good news!  Your physician recommends that you schedule a follow-up appointment in: 6 months with Dr Haroldine Laws, our office will call you to schedule this appointment.   At the [facility], you and your health needs are our priority. As part of our continuing mission to provide you with exceptional heart care, we have created designated Provider Care Teams. These Care Teams include your primary Cardiologist (physician) and Advanced Practice Providers (APPs- Physician Assistants and Nurse Practitioners) who all work together to provide you with the care you need, when you need it.   You may see any of the following providers on your designated Care Team at your next follow up: Marland Kitchen Dr Glori Bickers . Dr Loralie Champagne . Darrick Grinder, NP   Please be sure to bring in all your medications bottles to every appointment.

## 2019-01-30 NOTE — Addendum Note (Signed)
Encounter addended by: Valeda Malm, RN on: 01/30/2019 3:18 PM  Actions taken: Diagnosis association updated, Order list changed, Charge Capture section accepted, Clinical Note Signed

## 2019-01-30 NOTE — Progress Notes (Signed)
  Echocardiogram 2D Echocardiogram has been performed.  Burnett Kanaris 01/30/2019, 1:58 PM

## 2019-01-31 ENCOUNTER — Telehealth (HOSPITAL_COMMUNITY): Payer: Self-pay

## 2019-01-31 NOTE — Telephone Encounter (Signed)
Advised patient of lab results (elevated k), advised to stop Arlyce Harman and repeat blood work in 1 week. appt made. Pt verbalized understanding.

## 2019-01-31 NOTE — Telephone Encounter (Signed)
-----   Message from Jolaine Artist, MD sent at 01/30/2019  6:40 PM EDT ----- K is high. Stop spiro. Repeat BMET 1 week.

## 2019-02-07 ENCOUNTER — Ambulatory Visit (HOSPITAL_COMMUNITY)
Admission: RE | Admit: 2019-02-07 | Discharge: 2019-02-07 | Disposition: A | Discharge status: Home / Self Care | Payer: MEDICARE | Source: Ambulatory Visit | Attending: Cardiology | Admitting: Cardiology

## 2019-02-07 ENCOUNTER — Other Ambulatory Visit: Payer: Self-pay

## 2019-02-07 ENCOUNTER — Other Ambulatory Visit (HOSPITAL_COMMUNITY): Payer: Self-pay

## 2019-02-07 DIAGNOSIS — I447 Left bundle-branch block, unspecified: Secondary | ICD-10-CM | POA: Diagnosis not present

## 2019-02-07 LAB — BASIC METABOLIC PANEL
Anion gap: 8 (ref 5–15)
BUN: 13 mg/dL (ref 8–23)
CO2: 23 mmol/L (ref 22–32)
Calcium: 9.1 mg/dL (ref 8.9–10.3)
Chloride: 109 mmol/L (ref 98–111)
Creatinine, Ser: 1.12 mg/dL — ABNORMAL HIGH (ref 0.44–1.00)
GFR calc Af Amer: 55 mL/min — ABNORMAL LOW (ref >60)
GFR calc non Af Amer: 47 mL/min — ABNORMAL LOW (ref >60)
Glucose, Bld: 125 mg/dL — ABNORMAL HIGH (ref 70–99)
Potassium: 4.1 mmol/L (ref 3.5–5.1)
Sodium: 140 mmol/L (ref 135–145)

## 2019-03-23 ENCOUNTER — Encounter: Payer: MEDICARE | Admitting: Genetic Counselor

## 2019-05-18 ENCOUNTER — Telehealth (HOSPITAL_COMMUNITY): Payer: Self-pay

## 2019-05-18 NOTE — Telephone Encounter (Signed)
Patient states that she has been experiencing some low blood pressures(unable to give me except readings,was away from her BP log), dizziness for a few weeks. She states that it seems to happen when she is out doing yard work . However, for the last week and a half she hasn't had any problems at all. She also said about a month ago she saw a flash of blood in her urine but hasn't seen it anymore. I advised patient to continue monitoring her blood pressures,dizziness and to call us if they return/worsen. Patient was agreeable to plan.

## 2019-05-29 ENCOUNTER — Other Ambulatory Visit (HOSPITAL_COMMUNITY): Payer: Self-pay

## 2019-05-29 MED ORDER — ENTRESTO 49-51 MG PO TABS: 1.0000 | ORAL_TABLET | Freq: Two times a day (BID) | ORAL | 6 refills | Status: DC

## 2019-07-12 ENCOUNTER — Other Ambulatory Visit: Payer: Self-pay

## 2019-07-12 ENCOUNTER — Ambulatory Visit (HOSPITAL_COMMUNITY)
Admission: RE | Admit: 2019-07-12 | Discharge: 2019-07-12 | Disposition: A | Discharge status: Home / Self Care | Payer: MEDICARE | Source: Ambulatory Visit | Attending: Internal Medicine | Admitting: Internal Medicine

## 2019-07-12 ENCOUNTER — Encounter (HOSPITAL_COMMUNITY): Payer: Self-pay | Admitting: Internal Medicine

## 2019-07-12 VITALS — BP 140/82 | HR 65 | Wt 167.2 lb

## 2019-07-12 DIAGNOSIS — Z9010 Acquired absence of unspecified breast and nipple: Secondary | ICD-10-CM | POA: Insufficient documentation

## 2019-07-12 DIAGNOSIS — Z8249 Family history of ischemic heart disease and other diseases of the circulatory system: Secondary | ICD-10-CM | POA: Diagnosis not present

## 2019-07-12 DIAGNOSIS — Z803 Family history of malignant neoplasm of breast: Secondary | ICD-10-CM | POA: Diagnosis not present

## 2019-07-12 DIAGNOSIS — Z8042 Family history of malignant neoplasm of prostate: Secondary | ICD-10-CM | POA: Insufficient documentation

## 2019-07-12 DIAGNOSIS — I110 Hypertensive heart disease with heart failure: Secondary | ICD-10-CM | POA: Diagnosis not present

## 2019-07-12 DIAGNOSIS — I447 Left bundle-branch block, unspecified: Secondary | ICD-10-CM | POA: Insufficient documentation

## 2019-07-12 DIAGNOSIS — I471 Supraventricular tachycardia: Secondary | ICD-10-CM | POA: Diagnosis not present

## 2019-07-12 DIAGNOSIS — I5022 Chronic systolic (congestive) heart failure: Secondary | ICD-10-CM | POA: Diagnosis present

## 2019-07-12 DIAGNOSIS — I428 Other cardiomyopathies: Secondary | ICD-10-CM | POA: Diagnosis not present

## 2019-07-12 DIAGNOSIS — M8580 Other specified disorders of bone density and structure, unspecified site: Secondary | ICD-10-CM | POA: Diagnosis not present

## 2019-07-12 DIAGNOSIS — Z86000 Personal history of in-situ neoplasm of breast: Secondary | ICD-10-CM | POA: Diagnosis not present

## 2019-07-12 DIAGNOSIS — Z87891 Personal history of nicotine dependence: Secondary | ICD-10-CM | POA: Diagnosis not present

## 2019-07-12 DIAGNOSIS — Z79899 Other long term (current) drug therapy: Secondary | ICD-10-CM | POA: Insufficient documentation

## 2019-07-12 DIAGNOSIS — Z801 Family history of malignant neoplasm of trachea, bronchus and lung: Secondary | ICD-10-CM | POA: Diagnosis not present

## 2019-07-12 DIAGNOSIS — I10 Essential (primary) hypertension: Secondary | ICD-10-CM

## 2019-07-12 NOTE — Progress Notes (Signed)
Pt declined AVS. advised to call office for any concerns or needs.

## 2019-07-12 NOTE — Progress Notes (Addendum)
[facility]    PCP: Seward Carol, MD PCP-Cardiologist: Larae Grooms, MD   HPI: Jacqueline Murphy is a 79 y.o. female with h/o Chronic systolic CHF, fibroids, breast CA (tx with mastectomy only), HTN, GERD, Atrial Tach, MR, and LBBB referred by Melina Copa, PA-C for further evaluation of HF.   Dr. Curt Bears, and not candidate for ICD/CRT-D with NYHA I symptoms and lack of medical optimization.  Today she returns for HF follow up. Says she feels good. Main complaint is that she thinks Delene Loll may be giving her diarrhea. Is careful to watch her fluid intake to balance it to make sure she gets enough fluid but isn't bloated or constipated. Denies CP or SOB. Of lasix completely. No LE edema. At home SBP 120-130   Echo 9/20 EF 45-50% Grade I DD. Personally reviewed   Cardiac Testing.   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  Mild LAE  Echo 02/04/18 showed LVEF 30-35%, Grade 1 DD, Mild MR, Normal LA size, Mild VR dilation Echo 07/2012 showed EF 40%, mild LVH, moderate asymmetric septal hypertrophy, mild MR. Echo 2008 showed EF 55-60% Echo 02/2010 showed fall in EF to 20-25% with moderate-severe MR. She had an atrial tachycardia diagnosed at the time felt possibly cause of her cardiomyopathy. No prior cath.  Echo 07/2012 showed EF 40%, mild LVH, moderate asymmetric septal hypertrophy, mild MR.  Nuclear stress test 04/2010 showed no evidence of ischemia, EF 33%.   CPX 04/29/2018- normal CPX  Peak VO2: 18.0 (103% predicted peak VO2) VE/VCO2 slope: 32 OUES: 1.48 Peak RER: 1.12    Past Medical History:  Diagnosis Date  . Atrial tachycardia ([place])   . Cancer ([place]) 2008   LEFT Breast-Ductal CA in situ  . Fibroid   . GERD (gastroesophageal reflux disease)   . Hypertension   . LBBB (left bundle  branch block)   . Mitral regurgitation   . NICM (nonischemic cardiomyopathy) ([place])    a. dx 2011, EF 25-30% ? tachy induced. b. EF 40% in 2014. c. EF 30-35% in 01/2018, grade 1 DD.  . Osteopenia     Current Outpatient Medications  Medication Sig Dispense Refill  . atorvastatin (LIPITOR) 10 MG tablet Take 10 mg by mouth daily.    . Calcium Carbonate-Vitamin D (CALCIUM + D PO) Take 1 tablet by mouth 2 (two) times daily.     . carvedilol (COREG) 12.5 MG tablet Take 12.5 mg by mouth 2 (two) times daily with a meal.    . Cholecalciferol (VITAMIN D PO) Take 1,000 Units by mouth daily.     . dorzolamide (TRUSOPT) 2 % ophthalmic solution Place 1 drop into the right eye 2 (two) times daily.    . fexofenadine (ALLEGRA) 180 MG tablet Take 180 mg by mouth daily as needed for allergies or rhinitis.    . mometasone (NASONEX) 50 MCG/ACT nasal spray Place 2 sprays into the nose daily as needed.    . Multiple Vitamin (MULTIVITAMIN) tablet Take 1 tablet by mouth daily.    . sacubitril-valsartan (ENTRESTO) 49-51 MG Take 1 tablet by mouth 2 (two) times daily. 60 tablet 6   No current facility-administered medications for this encounter.    No Known Allergies    Social History   Socioeconomic History  . Marital status: Married    Spouse name: Not on file  . Number  of children: Not on file  . Years of education: Not on file  . Highest education level: Not on file  Occupational History  . Not on file  Tobacco Use  . Smoking status: Former Smoker    Quit date: 10/27/1981    Years since quitting: 37.7  . Smokeless tobacco: Never Used  Substance and Sexual Activity  . Alcohol use: No  . Drug use: No  . Sexual activity: Never    Birth control/protection: Surgical, Post-menopausal  Other Topics Concern  . Not on file  Social History Narrative  . Not on file   Social Determinants of Health   Financial Resource Strain:   . Difficulty of Paying Living Expenses: Not on file  Food Insecurity:   .  Worried About Charity fundraiser in the Last Year: Not on file  . Ran Out of Food in the Last Year: Not on file  Transportation Needs:   . Lack of Transportation (Medical): Not on file  . Lack of Transportation (Non-Medical): Not on file  Physical Activity:   . Days of Exercise per Week: Not on file  . Minutes of Exercise per Session: Not on file  Stress:   . Feeling of Stress : Not on file  Social Connections:   . Frequency of Communication with Friends and Family: Not on file  . Frequency of Social Gatherings with Friends and Family: Not on file  . Attends Religious Services: Not on file  . Active Member of Clubs or Organizations: Not on file  . Attends Archivist Meetings: Not on file  . Marital Status: Not on file  Intimate Partner Violence:   . Fear of Current or Ex-Partner: Not on file  . Emotionally Abused: Not on file  . Physically Abused: Not on file  . Sexually Abused: Not on file      Family History  Problem Relation Age of Onset  . Lung cancer Mother   . Cancer Mother        LUNG-   . Breast cancer Maternal Aunt   . Diabetes Other   . Cancer Brother        PROSTATE  . Heart Problems Father   . Hypertension Brother   . Heart attack Neg Hx   . Stroke Neg Hx     Vitals:   07/12/19 1350  BP: 140/82  Pulse: 65  SpO2: 99%  Weight: 75.8 kg (167 lb 3.2 oz)    Wt Readings from Last 3 Encounters:  07/12/19 75.8 kg (167 lb 3.2 oz)  01/30/19 75.8 kg (167 lb)  05/27/18 72.8 kg (160 lb 6.4 oz)   PHYSICAL EXAM: General:  Well appearing. No resp difficulty HEENT: normal Neck: supple. no JVD. Carotids 2+ bilat; no bruits. No lymphadenopathy or thryomegaly appreciated. Cor: PMI nondisplaced. Regular rate & rhythm. No rubs, gallops or murmurs. Lungs: clear Abdomen: soft, nontender, nondistended. No hepatosplenomegaly. No bruits or masses. Good bowel sounds. Extremities: no cyanosis, clubbing, rash, edema Neuro: alert & orientedx3, cranial nerves  grossly intact. moves all 4 extremities w/o difficulty. Affect pleasant  ECG: NSR 68. IVCD LVH. No ST-T wave abnormalities.    ASSESSMENT & PLAN:  1. 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  - Normal stress test in 2011, presumed NICM CMP. There is some question if due to LBBB vs long standing hypertension or all LVNC  -CPX test 12/20 was  normal.  -cMRI completed 12/23 showed LVEF 44% and noncompaction noted. No role for anticoagulation with EF > 35% and no arrhythmias. - Echo 9/20 45-50% - Stable NYHA I. Volume status looks good.  - Continue Entresto 49/51 mg BID will not increase with occasional episodes of low BP at night (has gone down as low as 88) - Spironolactone stopped due to hyperkalemia  - Continue coreg 12.5 mg BID. - I referred back to Genetics Counseling to identify need for family screening. She has decided not to pursue this  - Encouraged her to be a bit more active.     2. LBBB, Chronic - Per Dr. Curt Bears, not candidate for CRT-D .  -CMRI with EF 44% out of range for a device.    3. H/o Atrial Tachycardia - Quiescent on BB.    4. HTN - BP mildly elevated her but well controlled at home and even some periods of low BP. Will continue to follow.   RTC in 6 months with echo   Glori Bickers, MD  2:26 PM

## 2019-07-12 NOTE — Addendum Note (Signed)
Encounter addended by: Valeda Malm, RN on: 07/12/2019 2:33 PM  Actions taken: Order list changed, Diagnosis association updated, Clinical Note Signed

## 2019-07-12 NOTE — Patient Instructions (Signed)
Your physician has requested that you have an echocardiogram. Echocardiography is a painless test that uses sound waves to create images of your heart. It provides your doctor with information about the size and shape of your heart and how well your heart's chambers and valves are working. This procedure takes approximately one hour. There are no restrictions for this procedure.  Your physician recommends that you schedule a follow-up appointment in: 6 months with Dr Haroldine Laws and an ECHO.

## 2019-12-19 ENCOUNTER — Other Ambulatory Visit (HOSPITAL_COMMUNITY): Payer: Self-pay | Admitting: Internal Medicine

## 2020-01-23 IMAGING — MR MR CARD MORPHOLOGY WO/W CM
12 of 14 series · 38 of 40 positions shown · IV contrast (Gadavist)
Comparison: none

CLINICAL DATA: Cardiomyopathy

EXAM:
CARDIAC MRI
TECHNIQUE: The patient was scanned on a 1.5 Tesla Siemens magnet. A dedicated
cardiac coil was used. Functional imaging was done using Fiesta
sequences. [DATE], and 4 chamber views were done to assess for RWMA's.
Modified Brennan rule using a short axis stack was used to
calculate an ejection fraction on a dedicated work station using
Circle software. The patient received 8 cc of Gadavist . After 10
minutes inversion recovery sequences were used to assess for
infiltration and scar tissue.
CONTRAST:  Gadavist

[Series 6: bSSFP · oblique · 8.0mm · 1.52mm/px · 27 of 425 slices shown (1 of 4)]
[im 1/425]
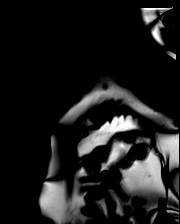
[im 17/425]
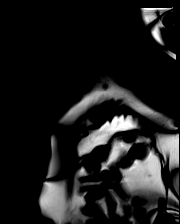
[im 33/425]
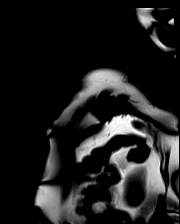
[im 49/425]
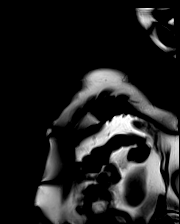
[im 66/425]
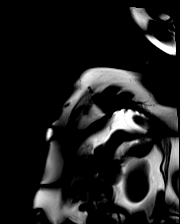
[im 82/425]
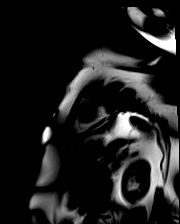
[im 98/425]
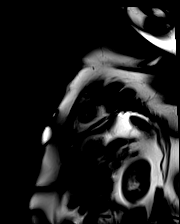
[im 115/425]
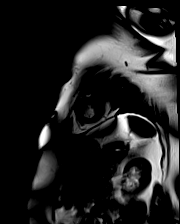
[im 131/425]
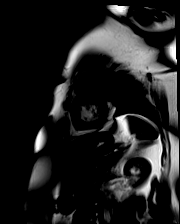
[im 147/425]
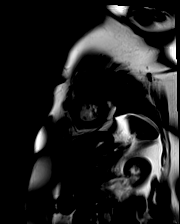
[im 164/425]
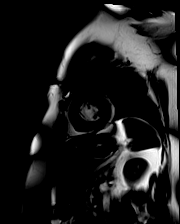
[im 180/425]
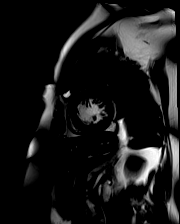
[im 196/425]
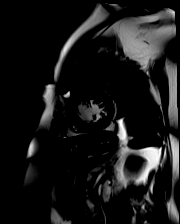
[im 213/425]
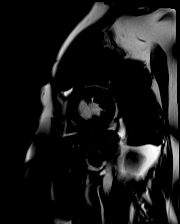
[im 229/425]
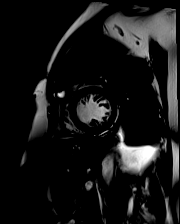
[im 245/425]
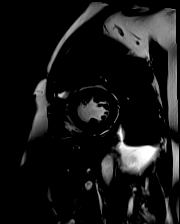
[im 261/425]
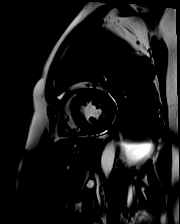
[im 278/425]
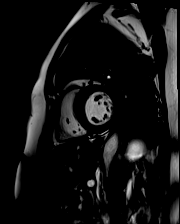
[im 294/425]
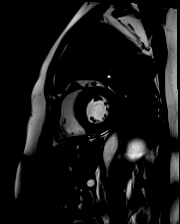
[im 310/425]
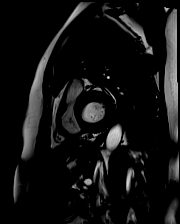
[im 327/425]
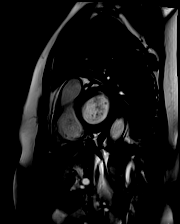
[im 343/425]
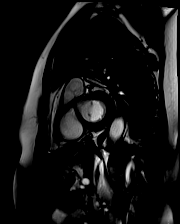
[im 359/425]
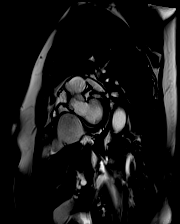
[im 376/425]
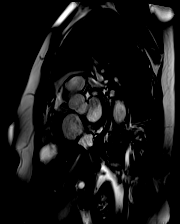
[im 392/425]
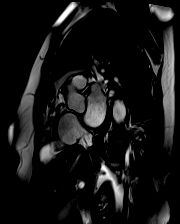
[im 408/425]
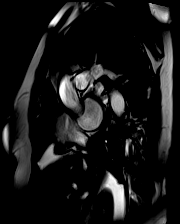
[im 425/425]
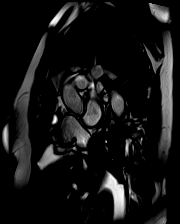

[Series 7: bSSFP · axial · 6.0mm · 1.33mm/px · 1 of 25 slices shown (2 of 4)]
[im 1/25]
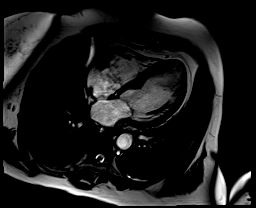

[Series 8: bSSFP · coronal · 6.0mm · 1.33mm/px · 1 of 25 slices shown (3 of 4)]
[im 1/25]
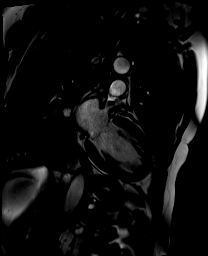

[Series 9: bSSFP · oblique · 6.0mm · 1.33mm/px · 1 of 25 slices shown (4 of 4)]
[im 1/25]
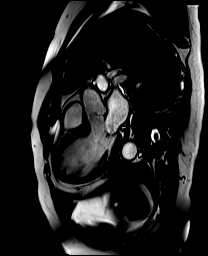

[Series 11: lge_single shot sa · oblique · 8.0mm · 1.98mm/px · 1 of 14 slices shown (1 of 2)]
[im 1/14]
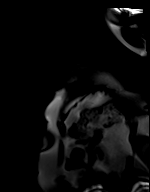

[Series 12: lge_single shot sa · oblique · 8.0mm · 1.98mm/px · 1 of 14 slices shown (2 of 2)]
[im 1/14]
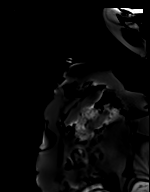

[Series 13: lge_single shot radial_mag · axial · 6.0mm · 1.77mm/px · 1 of 1 slices shown]
[im 1/1]
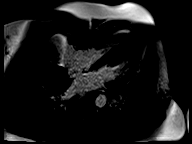

[Series 14: lge_single shot radial_psir · axial · 6.0mm · 1.77mm/px · 1 of 1 slices shown]
[im 1/1]
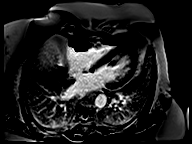

[Series 21: lge short axis_mag · oblique · 8.0mm · 1.50mm/px · 1 of 17 slices shown]
[im 1/17]
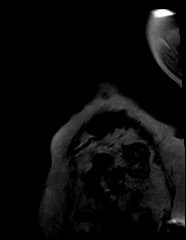

[Series 22: lge short axis_psir · oblique · 8.0mm · 1.50mm/px · 1 of 17 slices shown]
[im 1/17]
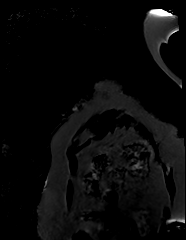

[Series 23: lge radial ((date)ch)_mag · axial · 6.0mm · 1.32mm/px · 1 of 1 slices shown]
[im 1/1]
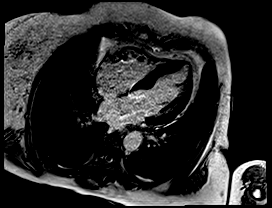

[Series 24: lge radial ((date)ch)_psir · axial · 6.0mm · 1.32mm/px · 1 of 1 slices shown]
[im 1/1]
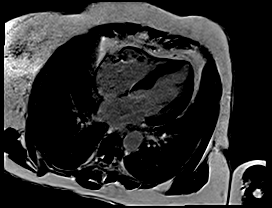

[38 of 40 positions shown; findings below may reference images not displayed]

FINDINGS: There was mild LAE. The RV was mildly dilated at the base. There was
no ASD/VSD/PFO. The aortic root was normal. There was mild MR. The
LV was moderately dilated with diffuse hypokinesis worse in the
septum and apex. There were prominent trabeculations in the apical
and mid LV cavity These met criteria for ventricular non compaction
with ratio of non compacted to compacted myocardium in diastole of
2.3

Delayed enhancement images showed no scar or infiltration
IMPRESSION: 1.Moderate LVE with diffuse hypokinesis worse in the septum and apex
EF 44%

2.Morphologic  findings consistent with ventricular non compaction

3.Mild RV dilatation

4.Mild MR

5.  Mild LAE

6. No delayed gadolinium uptake in LV myocardium on inversion
recovery sequence

Mega Lohr

Mega Lohr

## 2020-02-02 ENCOUNTER — Other Ambulatory Visit: Payer: Self-pay

## 2020-02-02 ENCOUNTER — Ambulatory Visit (HOSPITAL_COMMUNITY)
Admission: RE | Admit: 2020-02-02 | Discharge: 2020-02-02 | Disposition: A | Discharge status: Home / Self Care | Payer: MEDICARE | Source: Ambulatory Visit | Attending: Internal Medicine | Admitting: Internal Medicine

## 2020-02-02 ENCOUNTER — Ambulatory Visit (HOSPITAL_BASED_OUTPATIENT_CLINIC_OR_DEPARTMENT_OTHER)
Admission: RE | Admit: 2020-02-02 | Discharge: 2020-02-02 | Disposition: A | Discharge status: Home / Self Care | Payer: MEDICARE | Source: Ambulatory Visit | Attending: Internal Medicine | Admitting: Internal Medicine

## 2020-02-02 VITALS — BP 136/78 | HR 71 | Ht 65.0 in | Wt 163.0 lb

## 2020-02-02 DIAGNOSIS — I447 Left bundle-branch block, unspecified: Secondary | ICD-10-CM | POA: Diagnosis not present

## 2020-02-02 DIAGNOSIS — Z87891 Personal history of nicotine dependence: Secondary | ICD-10-CM | POA: Diagnosis not present

## 2020-02-02 DIAGNOSIS — L659 Nonscarring hair loss, unspecified: Secondary | ICD-10-CM | POA: Insufficient documentation

## 2020-02-02 DIAGNOSIS — I10 Essential (primary) hypertension: Secondary | ICD-10-CM | POA: Diagnosis not present

## 2020-02-02 DIAGNOSIS — I428 Other cardiomyopathies: Secondary | ICD-10-CM

## 2020-02-02 DIAGNOSIS — Z8249 Family history of ischemic heart disease and other diseases of the circulatory system: Secondary | ICD-10-CM | POA: Diagnosis not present

## 2020-02-02 DIAGNOSIS — Z79899 Other long term (current) drug therapy: Secondary | ICD-10-CM | POA: Insufficient documentation

## 2020-02-02 DIAGNOSIS — Z7951 Long term (current) use of inhaled steroids: Secondary | ICD-10-CM | POA: Insufficient documentation

## 2020-02-02 DIAGNOSIS — Z853 Personal history of malignant neoplasm of breast: Secondary | ICD-10-CM | POA: Insufficient documentation

## 2020-02-02 DIAGNOSIS — I5022 Chronic systolic (congestive) heart failure: Secondary | ICD-10-CM

## 2020-02-02 DIAGNOSIS — K219 Gastro-esophageal reflux disease without esophagitis: Secondary | ICD-10-CM | POA: Diagnosis not present

## 2020-02-02 DIAGNOSIS — Z803 Family history of malignant neoplasm of breast: Secondary | ICD-10-CM | POA: Diagnosis not present

## 2020-02-02 DIAGNOSIS — I110 Hypertensive heart disease with heart failure: Secondary | ICD-10-CM | POA: Diagnosis not present

## 2020-02-02 DIAGNOSIS — R197 Diarrhea, unspecified: Secondary | ICD-10-CM | POA: Insufficient documentation

## 2020-02-02 NOTE — Progress Notes (Signed)
[facility]    PCP: Seward Carol, MD PCP-Cardiologist: Larae Grooms, MD   HPI: Jacqueline Murphy is a 79 y.o. female with h/o Chronic systolic CHF, fibroids, breast CA (tx with mastectomy only), HTN, GERD, Atrial Tach, MR, and LBBB referred by Melina Copa, PA-C for further evaluation of HF.   Dr. Curt Bears, and not candidate for ICD/CRT-D with NYHA I symptoms and lack of medical optimization.  Today she returns for HF follow up. Says she is doing ok. Complains about loose stools and hair loss. Follows BP closely. Mostly ~115-125 during the day. At night BP drops some 100-115 and she can get dizzy. Breathing is good. Walks without problem Can go to the store. No edema, orthopnea or PND.   Echo today 02/02/20 EF 55-66% RV normal. Personally reviewed   Echo 9/20 EF 45-50% Grade I DD.    Cardiac Testing.   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  Mild LAE  Echo 02/04/18 showed LVEF 30-35%, Grade 1 DD, Mild MR, Normal LA size, Mild VR dilation Echo 07/2012 showed EF 40%, mild LVH, moderate asymmetric septal hypertrophy, mild MR. Echo 2008 showed EF 55-60% Echo 02/2010 showed fall in EF to 20-25% with moderate-severe MR. She had an atrial tachycardia diagnosed at the time felt possibly cause of her cardiomyopathy. No prior cath.  Echo 07/2012 showed EF 40%, mild LVH, moderate asymmetric septal hypertrophy, mild MR.  Nuclear stress test 04/2010 showed no evidence of ischemia, EF 33%.   CPX 04/29/2018- normal CPX  Peak VO2: 18.0 (103% predicted peak VO2) VE/VCO2 slope: 32 OUES: 1.48 Peak RER: 1.12    Past Medical History:  Diagnosis Date  . Atrial tachycardia ([place])   . Cancer ([place] [employer]) 2008   LEFT Breast-Ductal CA in situ  . Fibroid   . GERD (gastroesophageal reflux disease)   . Hypertension   .  LBBB (left bundle branch block)   . Mitral regurgitation   . NICM (nonischemic cardiomyopathy) ([place])    a. dx 2011, EF 25-30% ? tachy induced. b. EF 40% in 2014. c. EF 30-35% in 01/2018, grade 1 DD.  . Osteopenia     Current Outpatient Medications  Medication Sig Dispense Refill  . atorvastatin (LIPITOR) 10 MG tablet Take 10 mg by mouth daily.    . Calcium Carbonate-Vitamin D (CALCIUM + D PO) Take 1 tablet by mouth 1 day or 1 dose.     . carvedilol (COREG) 12.5 MG tablet Take 12.5 mg by mouth 2 (two) times daily with a meal.    . Cholecalciferol (VITAMIN D PO) Take 1,000 Units by mouth daily.     . Cholecalciferol (VITAMIN D-3) 25 MCG (1000 UT) CAPS Take by mouth. 1 tablet once a day    . dorzolamide (TRUSOPT) 2 % ophthalmic solution Place 1 drop into the right eye 2 (two) times daily.    Marland Kitchen ENTRESTO 49-51 MG TAKE 1 TABLET BY MOUTH TWICE DAILY 60 tablet 6  . fexofenadine (ALLEGRA) 180 MG tablet Take 180 mg by mouth daily as needed for allergies or rhinitis.    . mometasone (NASONEX) 50 MCG/ACT nasal spray Place 2 sprays into the nose daily as needed.    . Multiple Vitamin (MULTIVITAMIN) tablet Take 1 tablet by mouth daily.     No current facility-administered medications for this encounter.    No Known Allergies  Social History   Socioeconomic History  . Marital status: Married    Spouse name: Not on file  . Number of children: Not on file  . Years of education: Not on file  . Highest education level: Not on file  Occupational History  . Not on file  Tobacco Use  . Smoking status: Former Smoker    Quit date: 10/27/1981    Years since quitting: 38.2  . Smokeless tobacco: Never Used  Vaping Use  . Vaping Use: Never used  Substance and Sexual Activity  . Alcohol use: No  . Drug use: No  . Sexual activity: Never    Birth control/protection: Surgical, Post-menopausal  Other Topics Concern  . Not on file  Social History Narrative  . Not on file   Social Determinants of  Health   Financial Resource Strain:   . Difficulty of Paying Living Expenses: Not on file  Food Insecurity:   . Worried About Charity fundraiser in the Last Year: Not on file  . Ran Out of Food in the Last Year: Not on file  Transportation Needs:   . Lack of Transportation (Medical): Not on file  . Lack of Transportation (Non-Medical): Not on file  Physical Activity:   . Days of Exercise per Week: Not on file  . Minutes of Exercise per Session: Not on file  Stress:   . Feeling of Stress : Not on file  Social Connections:   . Frequency of Communication with Friends and Family: Not on file  . Frequency of Social Gatherings with Friends and Family: Not on file  . Attends Religious Services: Not on file  . Active Member of Clubs or Organizations: Not on file  . Attends Archivist Meetings: Not on file  . Marital Status: Not on file  Intimate Partner Violence:   . Fear of Current or Ex-Partner: Not on file  . Emotionally Abused: Not on file  . Physically Abused: Not on file  . Sexually Abused: Not on file      Family History  Problem Relation Age of Onset  . Lung cancer Mother   . Cancer Mother        LUNG-   . Breast cancer Maternal Aunt   . Diabetes Other   . Cancer Brother        PROSTATE  . Heart Problems Father   . Hypertension Brother   . Heart attack Neg Hx   . Stroke Neg Hx     Vitals:   02/02/20 1358  BP: 136/78  Pulse: 71  SpO2: 100%  Weight: 73.9 kg (163 lb)  Height: 5\' 5"  (1.651 m)    Wt Readings from Last 3 Encounters:  02/02/20 73.9 kg (163 lb)  07/12/19 75.8 kg (167 lb 3.2 oz)  01/30/19 75.8 kg (167 lb)   PHYSICAL EXAM: General:  Well appearing. No resp difficulty HEENT: normal Neck: supple. no JVD. Carotids 2+ bilat; no bruits. No lymphadenopathy or thryomegaly appreciated. Cor: PMI nondisplaced. Regular rate & rhythm. No rubs, gallops or murmurs. Lungs: clear Abdomen: soft, nontender, nondistended. No hepatosplenomegaly. No  bruits or masses. Good bowel sounds. Extremities: no cyanosis, clubbing, rash, edema Neuro: alert & orientedx3, cranial nerves grossly intact. moves all 4 extremities w/o difficulty. Affect pleasant   ASSESSMENT & PLAN:  1. 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  - Normal stress test in 2011, presumed  NICM CMP. There is some question if due to LBBB vs long standing hypertension or all LVNC  -CPX test 12/20 was normal.  -cMRI completed 12/23 showed LVEF 44% and noncompaction noted. No role for anticoagulation with EF > 35% and no arrhythmias. - Echo 9/20 45-50% - Echo today 02/02/20 EF 55-66% RV normal. Personally reviewed - Stable NYHA I. Volume status looks good.  - Continue Entresto 49/51 mg BID will not increase with low BP at night which can be symptoamtic - Spironolactone stopped due to hyperkalemia  - Continue coreg 12.5 mg BID. - I referred back to Genetics Counseling to identify need for family screening. She has decided not to pursue this     2. LBBB, Chronic - Per Dr. Curt Bears, not candidate for CRT-D .  -CMRI with EF 44% out of range for a device.    3. H/o Atrial Tachycardia - Quiescent on BB.    4. HTN - BP well controlled. Watch low pressures at night. Glori Bickers, MD  2:22 PM

## 2020-02-02 NOTE — Patient Instructions (Signed)
Please call our office in September 2022 to schedule your follow up appointment  If you have any questions or concerns before your next appointment please send [place] a message through [place] or call our office at [phone].    TO LEAVE A MESSAGE FOR THE NURSE SELECT OPTION 2, PLEASE LEAVE A MESSAGE INCLUDING: . YOUR NAME . DATE OF BIRTH . CALL BACK NUMBER . REASON FOR CALL**this is important as we prioritize the call backs  [place] AS LONG AS YOU CALL BEFORE 4:00 PM  At the [facility], you and your health needs are our priority. As part of our continuing mission to provide you with exceptional heart care, we have created designated Provider Care Teams. These Care Teams include your primary Cardiologist (physician) and Advanced Practice Providers (APPs- Physician Assistants and Nurse Practitioners) who all work together to provide you with the care you need, when you need it.   You may see any of the following providers on your designated Care Team at your next follow up: Marland Kitchen Dr Glori Bickers . Dr Loralie Champagne . Darrick Grinder, NP . Lyda Jester, PA . Audry Riles, PharmD   Please be sure to bring in all your medications bottles to every appointment.

## 2020-02-02 NOTE — Progress Notes (Signed)
°  Echocardiogram 2D Echocardiogram has been performed.  Jacqueline Murphy 02/02/2020, 1:44 PM

## 2020-02-02 NOTE — Addendum Note (Signed)
Encounter addended by: Malena Edman, RN on: 02/02/2020 2:31 PM  Actions taken: Clinical Note Signed

## 2020-02-03 LAB — ECHOCARDIOGRAM COMPLETE
Area-P 1/2: 2.76 cm2
Calc EF: 59.3 %
S' Lateral: 2.75 cm
Single Plane A2C EF: 56.6 %
Single Plane A4C EF: 59.5 %

## 2020-07-10 ENCOUNTER — Other Ambulatory Visit: Payer: Self-pay | Admitting: Internal Medicine

## 2020-07-10 ENCOUNTER — Other Ambulatory Visit (HOSPITAL_COMMUNITY): Payer: Self-pay | Admitting: Internal Medicine

## 2020-07-10 DIAGNOSIS — M8580 Other specified disorders of bone density and structure, unspecified site: Secondary | ICD-10-CM

## 2020-07-11 ENCOUNTER — Other Ambulatory Visit (HOSPITAL_COMMUNITY): Payer: Self-pay | Admitting: *Deleted

## 2020-07-22 ENCOUNTER — Telehealth (HOSPITAL_COMMUNITY): Payer: Self-pay | Admitting: Cardiology

## 2020-07-22 NOTE — Telephone Encounter (Signed)
Received a fax requesting medical records from [facility] physicians . Records were successfully faxed to: [phone] ,which was the number provided.. Medical request form will be scanned into patients chart.

## 2020-08-21 ENCOUNTER — Other Ambulatory Visit (HOSPITAL_COMMUNITY): Payer: Self-pay | Admitting: Internal Medicine

## 2020-08-22 ENCOUNTER — Other Ambulatory Visit (HOSPITAL_COMMUNITY): Payer: Self-pay

## 2020-08-22 ENCOUNTER — Telehealth (HOSPITAL_COMMUNITY): Payer: Self-pay | Admitting: Internal Medicine

## 2020-08-22 ENCOUNTER — Other Ambulatory Visit (HOSPITAL_COMMUNITY): Payer: Self-pay | Admitting: *Deleted

## 2020-08-22 NOTE — Telephone Encounter (Signed)
Patient called to f/u w/vm regarding denial for [facility], pt can be reached @[phone], please advise

## 2020-12-17 ENCOUNTER — Other Ambulatory Visit: Payer: Self-pay

## 2020-12-17 ENCOUNTER — Ambulatory Visit
Admission: RE | Admit: 2020-12-17 | Discharge: 2020-12-17 | Disposition: A | Discharge status: Home / Self Care | Payer: Medicare Other | Source: Ambulatory Visit | Attending: Internal Medicine | Admitting: Internal Medicine

## 2020-12-17 DIAGNOSIS — M8580 Other specified disorders of bone density and structure, unspecified site: Secondary | ICD-10-CM

## 2021-02-27 ENCOUNTER — Ambulatory Visit (HOSPITAL_COMMUNITY)
Admission: RE | Admit: 2021-02-27 | Discharge: 2021-02-27 | Disposition: A | Discharge status: Home / Self Care | Payer: MEDICARE | Source: Ambulatory Visit | Attending: Internal Medicine | Admitting: Internal Medicine

## 2021-02-27 ENCOUNTER — Encounter (HOSPITAL_COMMUNITY): Payer: Self-pay | Admitting: Internal Medicine

## 2021-02-27 ENCOUNTER — Other Ambulatory Visit (HOSPITAL_COMMUNITY): Payer: Self-pay | Admitting: *Deleted

## 2021-02-27 ENCOUNTER — Other Ambulatory Visit: Payer: Self-pay

## 2021-02-27 VITALS — BP 180/70 | HR 62 | Wt 160.6 lb

## 2021-02-27 DIAGNOSIS — I428 Other cardiomyopathies: Secondary | ICD-10-CM | POA: Diagnosis not present

## 2021-02-27 DIAGNOSIS — E782 Mixed hyperlipidemia: Secondary | ICD-10-CM

## 2021-02-27 DIAGNOSIS — I110 Hypertensive heart disease with heart failure: Secondary | ICD-10-CM | POA: Insufficient documentation

## 2021-02-27 DIAGNOSIS — Z8249 Family history of ischemic heart disease and other diseases of the circulatory system: Secondary | ICD-10-CM | POA: Diagnosis not present

## 2021-02-27 DIAGNOSIS — I5022 Chronic systolic (congestive) heart failure: Secondary | ICD-10-CM | POA: Diagnosis not present

## 2021-02-27 DIAGNOSIS — I447 Left bundle-branch block, unspecified: Secondary | ICD-10-CM

## 2021-02-27 DIAGNOSIS — E875 Hyperkalemia: Secondary | ICD-10-CM | POA: Insufficient documentation

## 2021-02-27 DIAGNOSIS — I10 Essential (primary) hypertension: Secondary | ICD-10-CM | POA: Diagnosis not present

## 2021-02-27 MED ORDER — ENTRESTO 24-26 MG PO TABS: 1.0000 | ORAL_TABLET | Freq: Two times a day (BID) | ORAL | 3 refills | Status: DC

## 2021-02-27 NOTE — Progress Notes (Signed)
[facility]    PCP: Seward Carol, MD PCP-Cardiologist: Larae Grooms, MD   HPI: Jacqueline Murphy is a 80 y.o. female with h/o Chronic systolic CHF, fibroids, breast CA (tx with mastectomy only), HTN, GERD, Atrial Tach, MR, and LBBB referred by Melina Copa, PA-C for further evaluation of HF.   Dr. Curt Bears, and not candidate for ICD/CRT-D with NYHA I symptoms and lack of medical optimization.  Today she returns for HF follow up. Feels good. No SOB, orthopnea or PND. Doing all activities without problem. Working in yard. Compliant with all meds. BP at home systolics 478-295 in the morning at home and 100-110 in the afternoon and can feel lightheaded   Echo 02/02/20 EF 55-60% RV normal. Personally reviewed   Echo 9/20 EF 45-50% Grade I DD.    Cardiac Testing.   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  Mild LAE  Echo 02/04/18 showed LVEF 30-35%, Grade 1 DD, Mild MR, Normal LA size, Mild VR dilation Echo 07/2012 showed EF 40%, mild LVH, moderate asymmetric septal hypertrophy, mild MR. Echo 2008 showed EF 55-60% Echo 02/2010 showed fall in EF to 20-25% with moderate-severe MR. She had an atrial tachycardia diagnosed at the time felt possibly cause of her cardiomyopathy. No prior cath.  Echo 07/2012 showed EF 40%, mild LVH, moderate asymmetric septal hypertrophy, mild MR.  Nuclear stress test 04/2010 showed no evidence of ischemia, EF 33%.   CPX 04/29/2018- normal CPX  Peak VO2: 18.0 (103% predicted peak VO2) VE/VCO2 slope:  32 OUES: 1.48 Peak RER: 1.12    Past Medical History:  Diagnosis Date   Atrial tachycardia ([place])    Cancer ([place]) 2008   LEFT Breast-Ductal CA in situ   Fibroid    GERD (gastroesophageal reflux disease)    Hypertension    LBBB (left bundle branch block)    Mitral  regurgitation    NICM (nonischemic cardiomyopathy) ([place])    a. dx 2011, EF 25-30% ? tachy induced. b. EF 40% in 2014. c. EF 30-35% in 01/2018, grade 1 DD.   Osteopenia     Current Outpatient Medications  Medication Sig Dispense Refill   atorvastatin (LIPITOR) 10 MG tablet Take 10 mg by mouth daily.     Calcium Carbonate-Vitamin D (CALCIUM + D PO) Take 1 tablet by mouth 1 day or 1 dose.      carvedilol (COREG) 12.5 MG tablet Take 12.5 mg by mouth 2 (two) times daily with a meal.     Cholecalciferol (VITAMIN D PO) Take 1,000 Units by mouth daily.      Cholecalciferol (VITAMIN D-3) 25 MCG (1000 UT) CAPS Take by mouth. 1 tablet once a day     dorzolamide (TRUSOPT) 2 % ophthalmic solution Place 1 drop into the right eye 2 (two) times daily.     ENTRESTO 49-51 MG TAKE 1 TABLET BY MOUTH TWICE DAILY 180 tablet 3   fexofenadine (ALLEGRA) 180 MG tablet Take 180 mg by mouth daily as needed for allergies or rhinitis.     mometasone (NASONEX) 50 MCG/ACT nasal spray Place 2 sprays into the nose daily as needed.     Multiple Vitamin (MULTIVITAMIN) tablet Take 1 tablet by mouth daily.     No current facility-administered medications for this encounter.    No Known Allergies    Social History  Socioeconomic History   Marital status: Married    Spouse name: Not on file   Number of children: Not on file   Years of education: Not on file   Highest education level: Not on file  Occupational History   Not on file  Tobacco Use   Smoking status: Former    Types: Cigarettes    Quit date: 10/27/1981    Years since quitting: 39.3   Smokeless tobacco: Never  Vaping Use   Vaping Use: Never used  Substance and Sexual Activity   Alcohol use: No   Drug use: No   Sexual activity: Never    Birth control/protection: Surgical, Post-menopausal  Other Topics Concern   Not on file  Social History Narrative   Not on file   Social Determinants of Health   Financial Resource Strain: Not on file  Food  Insecurity: Not on file  Transportation Needs: Not on file  Physical Activity: Not on file  Stress: Not on file  Social Connections: Not on file  Intimate Partner Violence: Not on file      Family History  Problem Relation Age of Onset   Lung cancer Mother    Cancer Mother        LUNG-    Breast cancer Maternal Aunt    Diabetes Other    Cancer Brother        PROSTATE   Heart Problems Father    Hypertension Brother    Heart attack Neg Hx    Stroke Neg Hx     Vitals:   02/27/21 1117  BP: (!) 180/70  Pulse: 62  SpO2: 99%  Weight: 72.8 kg (160 lb 9.6 oz)    Wt Readings from Last 3 Encounters:  02/27/21 72.8 kg (160 lb 9.6 oz)  02/02/20 73.9 kg (163 lb)  07/12/19 75.8 kg (167 lb 3.2 oz)   PHYSICAL EXAM: General:  Well appearing. No resp difficulty HEENT: normal Neck: supple. no JVD. Carotids 2+ bilat; no bruits. No lymphadenopathy or thryomegaly appreciated. Cor: PMI nondisplaced. Regular rate & rhythm. No rubs, gallops or murmurs. Lungs: clear Abdomen: soft, nontender, nondistended. No hepatosplenomegaly. No bruits or masses. Good bowel sounds. Extremities: no cyanosis, clubbing, rash, edema Neuro: alert & orientedx3, cranial nerves grossly intact. moves all 4 extremities w/o difficulty. Affect pleasant  ECG: NSR 62 IVCD (143ms Personally reviewed   ASSESSMENT & PLAN:  1. 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  - Normal stress test in 2011, presumed NICM CMP. There is some question if due to LBBB vs long standing hypertension or all LVNC  -CPX test 12/20 was normal.  -cMRI completed 12/23 showed LVEF 44% and noncompaction noted. No role for anticoagulation with EF > 35% and no arrhythmias. - Echo 9/20 45-50% - Echo 02/02/20 EF 55-60% RV normal. Personally reviewed - NYHA I volume status looks good.  - With low afternoon BPs will decrease  Entresto 49/51 mg BID to 24/26 bid   -  Spironolactone stopped due to hyperkalemia  - Continue coreg 12.5 mg BID. - I referred back to Genetics Counseling to identify need for family screening. She has decided not to pursue this  - Doing well EF normalized can graduate HF program. Will get repeat echo to make sure EF remains stable can refer back as needed  - recent bloodwork ok     2. LBBB, Chronic - Per Dr. Curt Bears, not  candidate for CRT-D .  - cMRI with EF 44% out of range for a device.    3. H/o Atrial Tachycardia - Quiescent on BB.    4. HTN, white coat - BP up here but systolics 038-882 in the morning at home and 100-110 in the afternoon     Glori Bickers, MD  12:05 PM

## 2021-02-27 NOTE — Patient Instructions (Signed)
Decrease Entresto to 24/26 mg Twice daily   Your physician has requested that you have an echocardiogram. Echocardiography is a painless test that uses sound waves to create images of your heart. It provides your doctor with information about the size and shape of your heart and how well your heart's chambers and valves are working. This procedure takes approximately one hour. There are no restrictions for this procedure.  CONGRATULATIONS!!!! You have graduated the [facility], please follow up with Dr Irish Lack in April 2023  You have been referred to follow up with Dr Irish Lack at [facility] in April 2023, his office will call you for this appointment   Do the following things EVERYDAY: Weigh yourself in the morning before breakfast. Write it down and keep it in a log. Take your medicines as prescribed Eat low salt foods--Limit salt (sodium) to 2000 mg per day.  Stay as active as you can everyday Limit all fluids for the day to less than 2 liters  If you have any questions or concerns before your next appointment please send [place] a message through [place] or call our office at [phone].    TO LEAVE A MESSAGE FOR THE NURSE SELECT OPTION 2, PLEASE LEAVE A MESSAGE INCLUDING: YOUR NAME DATE OF BIRTH CALL BACK NUMBER REASON FOR CALL**this is important as we prioritize the call backs  YOU WILL RECEIVE A CALL BACK THE SAME DAY AS LONG AS YOU CALL BEFORE 4:00 PM  At the [facility], you and your health needs are our priority. As part of our continuing mission to provide you with exceptional heart care, we have created designated Provider Care Teams. These Care Teams include your primary Cardiologist (physician) and Advanced Practice Providers (APPs- Physician Assistants and Nurse Practitioners) who all work together to provide you with the care you need, when you need it.   You may see any of the following providers on your designated Care Team at your  next follow up: Dr Glori Bickers Dr Haynes Kerns, NP Lyda Jester, [place] [facility] [place], [place] Audry Riles, PharmD   Please be sure to bring in all your medications bottles to every appointment.

## 2021-02-27 NOTE — Addendum Note (Signed)
Encounter addended by: Scarlette Calico, RN on: 02/27/2021 12:19 PM  Actions taken: Visit diagnoses modified, Pharmacy for encounter modified, Order list changed, Diagnosis association updated, Clinical Note Signed

## 2021-04-09 ENCOUNTER — Ambulatory Visit (HOSPITAL_COMMUNITY)
Admission: RE | Admit: 2021-04-09 | Discharge: 2021-04-09 | Disposition: A | Discharge status: Home / Self Care | Payer: MEDICARE | Source: Ambulatory Visit | Attending: Internal Medicine | Admitting: Internal Medicine

## 2021-04-09 ENCOUNTER — Other Ambulatory Visit: Payer: Self-pay

## 2021-04-09 DIAGNOSIS — I340 Nonrheumatic mitral (valve) insufficiency: Secondary | ICD-10-CM | POA: Diagnosis not present

## 2021-04-09 DIAGNOSIS — I5022 Chronic systolic (congestive) heart failure: Secondary | ICD-10-CM | POA: Diagnosis not present

## 2021-04-09 DIAGNOSIS — I700 Atherosclerosis of aorta: Secondary | ICD-10-CM | POA: Diagnosis not present

## 2021-04-09 DIAGNOSIS — I110 Hypertensive heart disease with heart failure: Secondary | ICD-10-CM | POA: Diagnosis present

## 2021-04-09 DIAGNOSIS — I447 Left bundle-branch block, unspecified: Secondary | ICD-10-CM | POA: Diagnosis not present

## 2021-04-09 LAB — ECHOCARDIOGRAM COMPLETE
AR max vel: 2.87 cm2
AV Peak grad: 3.9 mmHg
Ao pk vel: 0.98 m/s
Area-P 1/2: 3.85 cm2
Calc EF: 49.1 %
S' Lateral: 2.6 cm
Single Plane A2C EF: 51.7 %
Single Plane A4C EF: 46.4 %

## 2021-06-29 ENCOUNTER — Other Ambulatory Visit (HOSPITAL_COMMUNITY): Payer: Self-pay | Admitting: Internal Medicine

## 2021-08-05 NOTE — Progress Notes (Signed)
?  ?Cardiology Office Note ? ? ?Date:  08/06/2021  ? ?ID:  Jacqueline Murphy, DOB January 06, 1941, MRN [id number] ? ?PCP:  Seward Carol, MD  ? ? ?Chief Complaint  ?Patient presents with  ? Follow-up  ? ?Chronic systolic heart failure/NICM ? ?Wt Readings from Last 3 Encounters:  ?08/06/21 157 lb (71.2 kg)  ?02/27/21 160 lb 9.6 oz (72.8 kg)  ?02/02/20 163 lb (73.9 kg)  ?  ? ?  ?History of Present Illness: ?Jacqueline Murphy is a 81 y.o. female  who had a cardiomyopathy. It was likely nonischemic. Stress test was normal. Echo in 2014 showed improved LV function, EF 40%.  SHe has had longstanding LBBB. ? ?She declined cath in the past.  ? ?SHe was later diagnosed with LV noncompation.  She declined pursuing genetic testing for her family.   ? ?Records: ?"Echo 02/04/18 showed LVEF 30-35%, Grade 1 DD, Mild MR, Normal LA size, Mild VR dilation.  ?- Normal stress test in 2011, presumed NICM CMP. There is some question if due to LBBB vs long standing hypertension or all LVNC  ?-CPX test 12/20 was normal.  ?-cMRI completed 12/23 showed LVEF 44% and noncompaction noted. No role for anticoagulation with EF > 35% and no arrhythmias. ?- Echo 9/20 45-50% ?- Echo 02/02/20 EF 55-60% RV normal. Personally reviewed ?- NYHA I volume status looks good.  ?- With low afternoon BPs will decrease  Entresto 49/51 mg BID to 24/26 bid   ?- Spironolactone stopped due to hyperkalemia  ?- Continue coreg 12.5 mg BID. ?- I referred back to Genetics Counseling to identify need for family screening. She has decided not to pursue this  ?- Doing well EF normalized can graduate HF program. Will get repeat echo to make sure EF remains stable can refer back as needed " ? ?Dizziness improved with decrease in Entresto dose.  ? ?Denies : Chest pain. Dizziness. Leg edema. Nitroglycerin use. Orthopnea. Palpitations. Paroxysmal nocturnal dyspnea. Shortness of breath. Syncope.   ?  ? ? ? ?Past Medical History:  ?Diagnosis Date  ? Atrial tachycardia ([place])   ?  Cancer [facility]) 2008  ? LEFT Breast-Ductal CA in situ  ? Fibroid   ? GERD (gastroesophageal reflux disease)   ? Hypertension   ? LBBB (left bundle branch block)   ? Mitral regurgitation   ? NICM (nonischemic cardiomyopathy) ([place])   ? a. dx 2011, EF 25-30% ? tachy induced. b. EF 40% in 2014. c. EF 30-35% in 01/2018, grade 1 DD.  ? Osteopenia   ? ? ?Past Surgical History:  ?Procedure Laterality Date  ? ABDOMINAL HYSTERECTOMY  1990  ? TAH LSO  ? BREAST SURGERY  2008  ? Mastectomy-Left  ? BREAST SURGERY  2008  ? Reduction-Right  ? OOPHORECTOMY    ? LSO  ? TUBAL LIGATION    ? ? ? ?Current Outpatient Medications  ?Medication Sig Dispense Refill  ? atorvastatin (LIPITOR) 10 MG tablet Take 10 mg by mouth daily.    ? Calcium Carbonate-Vitamin D (CALCIUM + D PO) Take 1 tablet by mouth 1 day or 1 dose.     ? carvedilol (COREG) 12.5 MG tablet Take 12.5 mg by mouth 2 (two) times daily with a meal.    ? Cholecalciferol (VITAMIN D-3) 25 MCG (1000 UT) CAPS Take by mouth. 1 tablet once a day    ? dorzolamide (TRUSOPT) 2 % ophthalmic solution Place 1 drop into the right eye 2 (two) times daily.    ? ENTRESTO  24-26 MG TAKE 1 TABLET BY MOUTH TWICE DAILY 60 tablet 3  ? fexofenadine (ALLEGRA) 180 MG tablet Take 180 mg by mouth daily as needed for allergies or rhinitis.    ? mometasone (NASONEX) 50 MCG/ACT nasal spray Place 2 sprays into the nose daily as needed.    ? Multiple Vitamin (MULTIVITAMIN) tablet Take 1 tablet by mouth daily.    ? ?No current facility-administered medications for this visit.  ? ? ?Allergies:   Patient has no known allergies.  ? ? ?Social History:  The patient  reports that she quit smoking about 39 years ago. Her smoking use included cigarettes. She has never used smokeless tobacco. She reports that she does not drink alcohol and does not use drugs.  ? ?Family History:  The patient's family history includes Breast cancer in her maternal aunt; Cancer in her brother and mother; Diabetes in an other family member;  Heart Problems in her father; Hypertension in her brother; Lung cancer in her mother.  ? ? ?ROS:  Please see the history of present illness.   Otherwise, review of systems are positive for dry skin.   All other systems are reviewed and negative.  ? ? ?PHYSICAL EXAM: ?VS:  BP (!) 142/70   Pulse 68   Ht '5\' 5"'$  (1.651 m)   Wt 157 lb (71.2 kg)   SpO2 99%   BMI 26.13 kg/m?  , BMI Body mass index is 26.13 kg/m?. ?GEN: Well nourished, well developed, in no acute distress ?HEENT: normal ?Neck: no JVD, carotid bruits, or masses ?Cardiac: RRR; no murmurs, rubs, or gallops,no edema  ?Respiratory:  clear to auscultation bilaterally, normal work of breathing ?GI: soft, nontender, nondistended, + BS ?MS: no deformity or atrophy ?Skin: warm and dry, no rash ?Neuro:  Strength and sensation are intact ?Psych: euthymic mood, full affect ? ? ?EKG:   ?The ekg ordered 02/2021 demonstrates NSR, ILBBB ? ? ?Recent Labs: ?No results found for requested labs within last 8760 hours.  ? ?Lipid Panel ?No results found for: CHOL, TRIG, HDL, CHOLHDL, VLDL, LDLCALC, LDLDIRECT ?  ?Other studies Reviewed: ?Additional studies/ records that were reviewed today with results demonstrating: EF from March 2023 total cholesterol 138 HDL 53 LDL 65 triglycerides 108 creatinine 1.05 hemoglobin 11.9. ? ? ?ASSESSMENT AND PLAN: ? ?Nonischemic cardiomyopathy: LV noncompaction. Appears euvolemic. Continue current meds.   ?Hypertension: Blood pressures well controlled at home, typically in the 120s/70s at home.  Avoiding excessive salt.  Whole food, plant-based diet.  High-fiber diet.  Avoid processed foods. ?Left bundle branch block: intermittent complete and incomplete LBBB.  Longstanding. ?Chronic renal insufficiency: Staying well-hydrated, avoiding ibuprofen NSAIDs.  Off of PPI.  Just using the occasional Tums. ? ? ?Current medicines are reviewed at length with the patient today.  The patient concerns regarding her medicines were addressed. ? ?The following  changes have been made:  No change ? ?Labs/ tests ordered today include:  ?No orders of the defined types were placed in this encounter. ? ? ?Recommend 150 minutes/week of aerobic exercise ?Low fat, low carb, high fiber diet recommended ? ?Disposition:   FU in 1 year ? ? ?Signed, ?Larae Grooms, MD  ?08/06/2021 2:18 PM    ?[place] ?[address], [place], [place]  [address] ?Phone: [phone]; Fax: [phone]  ? ?

## 2021-08-06 ENCOUNTER — Ambulatory Visit (INDEPENDENT_AMBULATORY_CARE_PROVIDER_SITE_OTHER): Payer: MEDICARE | Admitting: Interventional Cardiology

## 2021-08-06 ENCOUNTER — Encounter: Payer: Self-pay | Admitting: Interventional Cardiology

## 2021-08-06 VITALS — BP 142/70 | HR 68 | Ht 65.0 in | Wt 157.0 lb

## 2021-08-06 DIAGNOSIS — I5022 Chronic systolic (congestive) heart failure: Secondary | ICD-10-CM

## 2021-08-06 DIAGNOSIS — E782 Mixed hyperlipidemia: Secondary | ICD-10-CM | POA: Diagnosis not present

## 2021-08-06 DIAGNOSIS — I10 Essential (primary) hypertension: Secondary | ICD-10-CM

## 2021-08-06 DIAGNOSIS — I447 Left bundle-branch block, unspecified: Secondary | ICD-10-CM

## 2021-08-06 DIAGNOSIS — I428 Other cardiomyopathies: Secondary | ICD-10-CM

## 2021-08-06 NOTE — Patient Instructions (Signed)

## 2021-08-26 ENCOUNTER — Telehealth: Payer: Self-pay

## 2021-08-26 NOTE — Telephone Encounter (Signed)
Jettie Booze, MD   ? ?I would not switch back to benazepril given the interaction with Entresto.  Can use irbesartan 150 mg daily.  Stop Entresto.  BMet in 1 week after change.  Monitor BP at home  ? ?

## 2021-08-26 NOTE — Telephone Encounter (Signed)
**Note De-Identified Jacqueline Murphy Obfuscation** The pt states that she called [employer] pt asst foundation today to ask what is needed when applying for Entresto asst and was advised that she has to include her husbands income as well as hers and that together she and her husbands income exceeds their limit for eligibility. ? ?She wants to know if she can take a less expensive medication than Entresto as she states that she and her husband's medications are too costly. ? ?She is aware that Delene Loll is the only medication of its kind and that she may not feel as well if switched back to a generic as that medication only makes up a part of Entresto. ? ?She states that she did not feel any better when she stopped taking Benazepril and started Entresto. ? ?She is aware that I am forwarding this message to Dr Irish Lack and his nurse for advisement. ?

## 2021-08-26 NOTE — Telephone Encounter (Signed)
I spoke with patient and gave her information from Dr Irish Lack.  She reports Delene Loll is expensive but she has been able to pay for it.  It gets very expensive later in the year.  Patient decided she will continue Entresto but will let us know if it becomes unaffordable.  ?

## 2021-09-12 ENCOUNTER — Telehealth: Payer: Self-pay | Admitting: Interventional Cardiology

## 2021-09-12 ENCOUNTER — Other Ambulatory Visit: Payer: Self-pay | Admitting: *Deleted

## 2021-09-12 MED ORDER — ENTRESTO 24-26 MG PO TABS: 1.0000 | ORAL_TABLET | Freq: Two times a day (BID) | ORAL | 5 refills | Status: DC

## 2021-09-12 NOTE — Telephone Encounter (Signed)
?*  STAT* If patient is at the pharmacy, call can be transferred to refill team. ? ? ?1. Which medications need to be refilled? (please list name of each medication and dose if known)  ?ENTRESTO 24-26 MG ? ?2. Which pharmacy/location (including street and city if local pharmacy) is medication to be sent to? ?[id number] - [place], [place] ? ?3. Do they need a 30 day or 90 day supply? 30 with refills  ? ? ?Patient states [facility] still has the rx for the higher dosage  ?

## 2022-03-09 ENCOUNTER — Other Ambulatory Visit: Payer: Self-pay | Admitting: Interventional Cardiology

## 2022-06-16 ENCOUNTER — Telehealth: Payer: Self-pay

## 2022-06-16 NOTE — Telephone Encounter (Signed)
**Note De-Identified Nikala Walsworth Obfuscation** Per letter received from [place], they have denied a tier exception for Entresto. Reason: Delene Loll is a name brand medication that is at the lowest tier possible per medicare part D as there is no other name brand medication to treat the pts condition in a lower tier.

## 2022-07-29 ENCOUNTER — Other Ambulatory Visit: Payer: Self-pay | Admitting: Interventional Cardiology

## 2022-08-28 ENCOUNTER — Other Ambulatory Visit: Payer: Self-pay | Admitting: Interventional Cardiology

## 2022-09-15 NOTE — Progress Notes (Unsigned)
Office Visit    Patient Name: Jacqueline Murphy Date of Encounter: 09/15/2022  Primary Care Provider:  Renford Dills, MD Primary Cardiologist:  Jacqueline Muss, MD Primary Electrophysiologist: None   Past Medical History    Past Medical History:  Diagnosis Date   Atrial tachycardia (HCC)    Cancer (HCC) 2008   LEFT Breast-Ductal CA in situ   Fibroid    GERD (gastroesophageal reflux disease)    Hypertension    LBBB (left bundle branch block)    Mitral regurgitation    NICM (nonischemic cardiomyopathy) (HCC)    a. dx 2011, EF 25-30% ? tachy induced. b. EF 40% in 2014. c. EF 30-35% in 01/2018, grade 1 DD.   Osteopenia    Past Surgical History:  Procedure Laterality Date   ABDOMINAL HYSTERECTOMY  1990   TAH LSO   BREAST SURGERY  2008   Mastectomy-Left   BREAST SURGERY  2008   Reduction-Right   OOPHORECTOMY     LSO   TUBAL LIGATION      Allergies  No Known Allergies   History of Present Illness    Jacqueline Murphy  is a 82year old female with a PMH of HFrEF, atrial tach, mitral regurgitation, NICM, HTN, LBBB, breast CA s/p mastectomy, who presents today for 1 year follow-up.  Jacqueline Murphy is being followed by Jacqueline Murphy over 15 years for management of nonischemic cardiomyopathy.  She had an EF of 20-25% in 2011 with moderate severe MR.  It was thought that her atrial tachycardia with the cause of her cardiomyopathy.  She had nuclear stress test completed in 2019 that showed no evidence of ischemia.  She was referred to the heart failure clinic in 2019 for optimization of therapy.  She was seen by Jacqueline Murphy and found not to be a candidate for ICD/CRT-D due to lack of medical optimization.  She underwent a cardiac MRI that showed moderate LVE with diffuse hypokinesis and ventricular noncompaction.  2D echo was completed 01/2020 with recovered EF of 55-60% with normal RV function.  She had repeat echo completed in 2022 that showed stable EF of 50-55% with no  RWMA and mild LVH with grade 1 DD and mild MVR.  She was last seen by Jacqueline Murphy on 08/06/2021 for annual follow-up.  She had improvement to dizziness with decreasing Entresto dose.  She was euvolemic on exam and was advised to continue current medications.  Her blood pressure was well-controlled in the 120s over 70s.  Since last being seen in the office patient reports***.  Patient denies chest pain, palpitations, dyspnea, PND, orthopnea, nausea, vomiting, dizziness, syncope, edema, weight gain, or early satiety.     ***Notes:  Home Medications    Current Outpatient Medications  Medication Sig Dispense Refill   atorvastatin (LIPITOR) 10 MG tablet Take 10 mg by mouth daily.     Calcium Carbonate-Vitamin D (CALCIUM + D PO) Take 1 tablet by mouth 1 day or 1 dose.      carvedilol (COREG) 12.5 MG tablet Take 12.5 mg by mouth 2 (two) times daily with a meal.     Cholecalciferol (VITAMIN D-3) 25 MCG (1000 UT) CAPS Take by mouth. 1 tablet once a day     dorzolamide (TRUSOPT) 2 % ophthalmic solution Place 1 drop into the right eye 2 (two) times daily.     fexofenadine (ALLEGRA) 180 MG tablet Take 180 mg by mouth daily as needed for allergies or rhinitis.     mometasone (  NASONEX) 50 MCG/ACT nasal spray Place 2 sprays into the nose daily as needed.     Multiple Vitamin (MULTIVITAMIN) tablet Take 1 tablet by mouth daily.     sacubitril-valsartan (ENTRESTO) 24-26 MG Take 1 tablet by mouth 2 (two) times daily. 60 tablet 3   No current facility-administered medications for this visit.     Review of Systems  Please see the history of present illness.    (+)*** (+)***  All other systems reviewed and are otherwise negative except as noted above.  Physical Exam    Wt Readings from Last 3 Encounters:  08/06/21 157 lb (71.2 kg)  02/27/21 160 lb 9.6 oz (72.8 kg)  02/02/20 163 lb (73.9 kg)   EA:VWUJW were no vitals filed for this visit.,There is no height or weight on file to calculate  BMI.  Constitutional:      Appearance: Healthy appearance. Not in distress.  Neck:     Vascular: JVD normal.  Pulmonary:     Effort: Pulmonary effort is normal.     Breath sounds: No wheezing. No rales. Diminished in the bases Cardiovascular:     Normal rate. Regular rhythm. Normal S1. Normal S2.      Murmurs: There is no murmur.  Edema:    Peripheral edema absent.  Abdominal:     Palpations: Abdomen is soft non tender. There is no hepatomegaly.  Skin:    General: Skin is warm and dry.  Neurological:     General: No focal deficit present.     Mental Status: Alert and oriented to person, place and time.     Cranial Nerves: Cranial nerves are intact.  EKG/LABS/ Recent Cardiac Studies    ECG personally reviewed by me today - ***  Cardiac Studies & Procedures     STRESS TESTS  NM MYOCAR MULTI W/SPECT W 09/15/2013   ECHOCARDIOGRAM  ECHOCARDIOGRAM COMPLETE 04/09/2021  Narrative ECHOCARDIOGRAM REPORT    Patient Name:   Jacqueline Murphy Date of Exam: 04/09/2021 Medical Rec #:  [id number]             Height:       65.0 in Accession #:    [id number]            Weight:       160.6 lb Date of Birth:  1941-03-20              BSA:          1.802 m Patient Age:    82 years              BP:           180/67 mmHg Patient Gender: F                     HR:           62 bpm. Exam Location:  Outpatient  Procedure: 2D Echo, Cardiac Doppler, Color Doppler and Strain Analysis  Indications:    CHF  History:        Patient has prior history of Echocardiogram examinations. Arrythmias:LBBB; Risk Factors:Hypertension.  Sonographer:    Jacqueline Murphy Referring Phys: [id number] Jacqueline Murphy  IMPRESSIONS   1. Left ventricular ejection fraction, by estimation, is 50 to 55%. The left ventricle has low normal function. The left ventricle has no regional wall motion abnormalities. There is mild left ventricular hypertrophy. Left ventricular diastolic parameters are consistent with Grade I  diastolic dysfunction (impaired relaxation). 2. Right ventricular  systolic function is normal. The right ventricular size is normal. 3. Left atrial size was mild to moderately dilated. 4. Right atrial size was mildly dilated. 5. The mitral valve is normal in structure. Mild mitral valve regurgitation. No evidence of mitral stenosis. 6. The aortic valve is normal in structure. There is mild calcification of the aortic valve. Aortic valve regurgitation is not visualized. No aortic stenosis is present. 7. The inferior vena cava is normal in size with greater than 50% respiratory variability, suggesting right atrial pressure of 3 mmHg.  FINDINGS Left Ventricle: Left ventricular ejection fraction, by estimation, is 50 to 55%. The left ventricle has low normal function. The left ventricle has no regional wall motion abnormalities. The left ventricular internal cavity size was normal in size. There is mild left ventricular hypertrophy. Left ventricular diastolic parameters are consistent with Grade I diastolic dysfunction (impaired relaxation).  Right Ventricle: The right ventricular size is normal. No increase in right ventricular wall thickness. Right ventricular systolic function is normal.  Left Atrium: Left atrial size was mild to moderately dilated.  Right Atrium: Right atrial size was mildly dilated.  Pericardium: There is no evidence of pericardial effusion.  Mitral Valve: The mitral valve is normal in structure. Mild mitral valve regurgitation. No evidence of mitral valve stenosis.  Tricuspid Valve: The tricuspid valve is normal in structure. Tricuspid valve regurgitation is mild . No evidence of tricuspid stenosis.  Aortic Valve: The aortic valve is normal in structure. There is mild calcification of the aortic valve. Aortic valve regurgitation is not visualized. No aortic stenosis is present. Aortic valve peak gradient measures 3.9 mmHg.  Pulmonic Valve: The pulmonic valve was normal in  structure. Pulmonic valve regurgitation is mild. No evidence of pulmonic stenosis.  Aorta: The aortic root is normal in size and structure.  Venous: The inferior vena cava is normal in size with greater than 50% respiratory variability, suggesting right atrial pressure of 3 mmHg.  IAS/Shunts: No atrial level shunt detected by color flow Doppler.   LEFT VENTRICLE PLAX 2D LVIDd:         3.10 cm      Diastology LVIDs:         2.60 cm      LV e' medial:    5.11 cm/s LV PW:         1.20 cm      LV E/e' medial:  11.8 LV IVS:        1.40 cm      LV e' lateral:   5.66 cm/s LVOT diam:     2.10 cm      LV E/e' lateral: 10.7 LV SV:         74 LV SV Index:   41 LVOT Area:     3.46 cm  3D Volume EF: LV Volumes (MOD)            3D EF:        29 % LV vol d, MOD A2C: 101.0 ml LV EDV:       96 ml LV vol d, MOD A4C: 101.0 ml LV ESV:       68 ml LV vol s, MOD A2C: 48.8 ml  LV SV:        28 ml LV vol s, MOD A4C: 54.1 ml LV SV MOD A2C:     52.2 ml LV SV MOD A4C:     101.0 ml LV SV MOD BP:      50.3 ml  RIGHT VENTRICLE             IVC RV Basal diam:  3.60 cm     IVC diam: 1.20 cm RV Mid diam:    2.70 cm RV S prime:     11.20 cm/s TAPSE (M-mode): 2.3 cm  LEFT ATRIUM             Index        RIGHT ATRIUM           Index LA diam:        3.10 cm 1.72 cm/m   RA Area:     14.80 cm LA Vol (A2C):   51.6 ml 28.63 ml/m  RA Volume:   38.50 ml  21.36 ml/m LA Vol (A4C):   28.8 ml 15.98 ml/m LA Biplane Vol: 40.5 ml 22.47 ml/m AORTIC VALVE AV Area (Vmax): 2.87 cm AV Vmax:        98.25 cm/s AV Peak Grad:   3.9 mmHg LVOT Vmax:      81.40 cm/s LVOT Vmean:     58.500 cm/s LVOT VTI:       0.213 m  AORTA Ao Root diam: 3.00 cm Ao Asc diam:  2.50 cm  MITRAL VALVE               TRICUSPID VALVE MV Area (PHT): 3.85 cm    TR Peak grad:   19.9 mmHg MV Decel Time: 197 msec    TR Vmax:        223.00 cm/s MV E velocity: 60.30 cm/s MV A velocity: 84.90 cm/s  SHUNTS MV E/A ratio:  0.71        Systemic  VTI:  0.21 m Systemic Diam: 2.10 cm  Arvilla Meres MD Electronically signed by Arvilla Meres MD Signature Date/Time: 04/09/2021/11:31:58 AM    Final      CARDIAC MRI  MR CARDIAC MORPHOLOGY W WO CONTRAST 05/02/2018  Narrative CLINICAL DATA:  Cardiomyopathy  EXAM: CARDIAC MRI  TECHNIQUE: The patient was scanned on a 1.5 Tesla Siemens magnet. A dedicated cardiac coil was used. Functional imaging was done using Fiesta sequences. 2,3, and 4 chamber views were done to assess for RWMA's. Modified Simpson's rule using a short axis stack was used to calculate an ejection fraction on a dedicated work [employer]. The patient received 8 cc of Gadavist . After 10 minutes inversion recovery sequences were used to assess for infiltration and scar tissue.  CONTRAST:  Gadavist  FINDINGS: There was mild LAE. The RV was mildly dilated at the base. There was no ASD/VSD/PFO. The aortic root was normal. There was mild MR. The LV was moderately dilated with diffuse hypokinesis worse in the septum and apex. 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  Mild LAE  6. No delayed gadolinium uptake in LV myocardium on inversion recovery sequence  Dillon Bjork   Electronically Signed By: Charlton Haws M.D. On: 05/02/2018 14:52         Risk Assessment/Calculations:   {Does this patient have ATRIAL FIBRILLATION?:[phone]}        Lab Results  Component Value Date   WBC 7.1 01/30/2019   HGB 12.3 01/30/2019   HCT 38.7 01/30/2019   MCV 90.0  01/30/2019   PLT 228 01/30/2019   Lab Results  Component Value Date   CREATININE 1.12 (H) 02/07/2019   BUN 13 02/07/2019   NA 140 02/07/2019   K 4.1 02/07/2019   CL 109 02/07/2019   CO2 23 02/07/2019   Lab Results  Component Value Date   ALT 13 11/18/2012   AST 19 11/18/2012   ALKPHOS 56 11/18/2012   BILITOT 0.36 11/18/2012   No results found for: "CHOL", "HDL", "LDLCALC", "LDLDIRECT", "TRIG", "CHOLHDL"  No results found for: "HGBA1C"   Assessment & Plan    1.  HFrEF/NICM: -2D echo completed in 01/2018 with EF of 30-35% and most recent echo completed 01/2021 with EF of 55-60%. -Today patient is*** -Continue current GDMT with Entresto 24/26 mg daily, carvedilol 12.5 mg twice daily  2.  History of LBBB: -Patient with improved EF and not a candidate for CRT-D per Jacqueline Murphy  3.  Essential hypertension: -Patient's blood pressure today was*** -Continue***  4.  Hyperlipidemia: -Patient's last LDL cholesterol was*** -Continue***  5.  History of atrial tachycardia: -Patient currently on beta-blocker with no reports***       Disposition: Follow-up with Jacqueline Muss, MD or APP in *** months {Are you ordering a CV Procedure (e.g. stress test, cath, DCCV, TEE, etc)?   Press F2        :[id number]}   Medication Adjustments/Labs and Tests Ordered: Current medicines are reviewed at length with the patient today.  Concerns regarding medicines are outlined above.   Signed, Napoleon Form, Leodis Rains, NP 09/15/2022, 6:24 PM [facility]

## 2022-09-16 ENCOUNTER — Encounter: Payer: Self-pay | Admitting: Nurse Practitioner

## 2022-09-16 ENCOUNTER — Ambulatory Visit: Payer: MEDICARE | Attending: Nurse Practitioner | Admitting: Nurse Practitioner

## 2022-09-16 VITALS — BP 110/80 | HR 63 | Ht 65.0 in | Wt 153.6 lb

## 2022-09-16 DIAGNOSIS — I428 Other cardiomyopathies: Secondary | ICD-10-CM

## 2022-09-16 DIAGNOSIS — I4719 Other supraventricular tachycardia: Secondary | ICD-10-CM

## 2022-09-16 DIAGNOSIS — I447 Left bundle-branch block, unspecified: Secondary | ICD-10-CM

## 2022-09-16 DIAGNOSIS — I10 Essential (primary) hypertension: Secondary | ICD-10-CM | POA: Diagnosis present

## 2022-09-16 DIAGNOSIS — E782 Mixed hyperlipidemia: Secondary | ICD-10-CM

## 2022-09-16 DIAGNOSIS — I5020 Unspecified systolic (congestive) heart failure: Secondary | ICD-10-CM | POA: Diagnosis present

## 2022-09-16 MED ORDER — ENTRESTO 24-26 MG PO TABS: 1.0000 | ORAL_TABLET | Freq: Two times a day (BID) | ORAL | 3 refills | Status: DC

## 2022-09-16 NOTE — Patient Instructions (Signed)
Medication Instructions:  Your physician recommends that you continue on your current medications as directed. Please refer to the Current Medication list given to you today.   *If you need a refill on your cardiac medications before your next appointment, please call your pharmacy*   Lab Work: None ordered If you have labs (blood work) drawn today and your tests are completely normal, you will receive your results only by: MyChart Message (if you have MyChart) OR A paper copy in the mail If you have any lab test that is abnormal or we need to change your treatment, we will call you to review the results.   Testing/Procedures: Your physician has requested that you have an echocardiogram. Echocardiography is a painless test that uses sound waves to create images of your heart. It provides your doctor with information about the size and shape of your heart and how well your heart's chambers and valves are working. This procedure takes approximately one hour. There are no restrictions for this procedure. Please do NOT wear cologne, perfume, aftershave, or lotions (deodorant is allowed). Please arrive 15 minutes prior to your appointment time. SCHEDULE IN FOR NOVEMBER 2024   Follow-Up: At [facility], you and your health needs are our priority.  As part of our continuing mission to provide you with exceptional heart care, we have created designated Provider Care Teams.  These Care Teams include your primary Cardiologist (physician) and Advanced Practice Providers (APPs -  Physician Assistants and Nurse Practitioners) who all work together to provide you with the care you need, when you need it.  We recommend signing up for the patient portal called "MyChart".  Sign up information is provided on this After Visit Summary.  MyChart is used to connect with patients for Virtual Visits (Telemedicine).  Patients are able to view lab/test results, encounter notes, upcoming appointments, etc.   Non-urgent messages can be sent to your provider as well.   To learn more about what you can do with MyChart, go to [email or web address].    Your next appointment:   12 month(s)  Provider:   Lance Muss, MD  or Robin Searing, NP   Other Instructions

## 2023-02-02 ENCOUNTER — Other Ambulatory Visit: Payer: Self-pay | Admitting: Internal Medicine

## 2023-02-02 DIAGNOSIS — M8580 Other specified disorders of bone density and structure, unspecified site: Secondary | ICD-10-CM

## 2023-02-17 ENCOUNTER — Other Ambulatory Visit: Payer: MEDICARE

## 2023-03-29 ENCOUNTER — Ambulatory Visit (HOSPITAL_COMMUNITY): Payer: MEDICARE | Attending: Nurse Practitioner

## 2023-03-29 DIAGNOSIS — I447 Left bundle-branch block, unspecified: Secondary | ICD-10-CM | POA: Diagnosis not present

## 2023-03-29 DIAGNOSIS — I4719 Other supraventricular tachycardia: Secondary | ICD-10-CM | POA: Diagnosis present

## 2023-03-29 DIAGNOSIS — I428 Other cardiomyopathies: Secondary | ICD-10-CM | POA: Diagnosis not present

## 2023-03-29 DIAGNOSIS — E782 Mixed hyperlipidemia: Secondary | ICD-10-CM

## 2023-03-29 DIAGNOSIS — I10 Essential (primary) hypertension: Secondary | ICD-10-CM

## 2023-03-29 DIAGNOSIS — I5020 Unspecified systolic (congestive) heart failure: Secondary | ICD-10-CM

## 2023-03-29 LAB — ECHOCARDIOGRAM COMPLETE
Area-P 1/2: 2.56 cm2
S' Lateral: 2.4 cm

## 2023-03-30 ENCOUNTER — Other Ambulatory Visit: Payer: Self-pay

## 2023-03-30 MED ORDER — ENTRESTO 24-26 MG PO TABS: 1.0000 | ORAL_TABLET | Freq: Two times a day (BID) | ORAL | 2 refills | Status: DC

## 2023-03-30 NOTE — Telephone Encounter (Signed)
Contacted the patient to go over test results and she requested a refill on her Entresto.   Reviewed chart and realized and 1.5 month supply was sent to the pharmacy. 90 day supply resent to the pharmacy.   Rx(s) sent to pharmacy electronically.  Patient thanked me for my help and voiced understanding.

## 2023-04-14 ENCOUNTER — Ambulatory Visit (HOSPITAL_COMMUNITY): Payer: MEDICARE

## 2023-07-06 ENCOUNTER — Encounter: Payer: Self-pay | Admitting: Internal Medicine

## 2023-09-14 ENCOUNTER — Other Ambulatory Visit: Payer: MEDICARE

## 2024-01-11 ENCOUNTER — Telehealth: Payer: Self-pay | Admitting: Interventional Cardiology

## 2024-01-11 ENCOUNTER — Encounter (HOSPITAL_BASED_OUTPATIENT_CLINIC_OR_DEPARTMENT_OTHER): Payer: Self-pay

## 2024-01-11 NOTE — Telephone Encounter (Signed)
 Spoke to Jackee Alberts NP Covering. Because Pt is overdue she must keep appt on 9/3 for any refills. No refill.

## 2024-01-11 NOTE — Telephone Encounter (Signed)
*  STAT* If patient is at the pharmacy, call can be transferred to refill team.   1. Which medications need to be refilled? (please list name of each medication and dose if known)   sacubitril -valsartan  (ENTRESTO ) 24-26 MG   2. Would you like to learn more about the convenience, safety, & potential cost savings by using the [facility]?   3. Are you open to using the [facility] (Type [facility]. ).  4. Which pharmacy/location (including street and city if local pharmacy) is medication to be sent to?  [id number] - [employer], [place] - [address] AT [facility] OF [address]   5. Do they need a 30 day or 90 day supply?   90 day  Patient stated she has 5 days left of this medication.  Patient has appointment scheduled on 9/3 with CHARLENA Alberts, NP

## 2024-01-12 ENCOUNTER — Ambulatory Visit: Payer: MEDICARE | Attending: Nurse Practitioner | Admitting: Nurse Practitioner

## 2024-01-12 ENCOUNTER — Encounter: Payer: Self-pay | Admitting: Nurse Practitioner

## 2024-01-12 VITALS — BP 134/68 | HR 58 | Ht 65.0 in | Wt 146.0 lb

## 2024-01-12 DIAGNOSIS — I428 Other cardiomyopathies: Secondary | ICD-10-CM | POA: Insufficient documentation

## 2024-01-12 DIAGNOSIS — R0989 Other specified symptoms and signs involving the circulatory and respiratory systems: Secondary | ICD-10-CM | POA: Diagnosis present

## 2024-01-12 DIAGNOSIS — I4719 Other supraventricular tachycardia: Secondary | ICD-10-CM | POA: Diagnosis present

## 2024-01-12 DIAGNOSIS — I447 Left bundle-branch block, unspecified: Secondary | ICD-10-CM | POA: Insufficient documentation

## 2024-01-12 DIAGNOSIS — I10 Essential (primary) hypertension: Secondary | ICD-10-CM | POA: Diagnosis present

## 2024-01-12 DIAGNOSIS — I5020 Unspecified systolic (congestive) heart failure: Secondary | ICD-10-CM | POA: Insufficient documentation

## 2024-01-12 DIAGNOSIS — E782 Mixed hyperlipidemia: Secondary | ICD-10-CM | POA: Diagnosis present

## 2024-01-12 MED ORDER — SACUBITRIL-VALSARTAN 24-26 MG PO TABS: 1.0000 | ORAL_TABLET | Freq: Two times a day (BID) | ORAL | 2 refills | Status: AC

## 2024-01-12 NOTE — Patient Instructions (Signed)
 Medication Instructions:  Your physician recommends that you continue on your current medications as directed. Please refer to the Current Medication list given to you today. *If you need a refill on your cardiac medications before your next appointment, please call your pharmacy*  Lab Work: None Ordered If you have labs (blood work) drawn today and your tests are completely normal, you will receive your results only by: MyChart Message (if you have MyChart) OR A paper copy in the mail If you have any lab test that is abnormal or we need to change your treatment, we will call you to review the results.  Testing/Procedures: Your physician has requested that you have a carotid duplex. This test is an ultrasound of the carotid arteries in your neck. It looks at blood flow through these arteries that supply the brain with blood. Allow one hour for this exam. There are no restrictions or special instructions.   Follow-Up: At [facility], you and your health needs are our priority.  As part of our continuing mission to provide you with exceptional heart care, our providers are all part of one team.  This team includes your primary Cardiologist (physician) and Advanced Practice Providers or APPs (Physician Assistants and Nurse Practitioners) who all work together to provide you with the care you need, when you need it.  Your next appointment:   6 month(s)  Provider:   Georganna Archer, MD    We recommend signing up for the patient portal called MyChart.  Sign up information is provided on this After Visit Summary.  MyChart is used to connect with patients for Virtual Visits (Telemedicine).  Patients are able to view lab/test results, encounter notes, upcoming appointments, etc.  Non-urgent messages can be sent to your provider as well.   To learn more about what you can do with MyChart, go to [email or web address].   Other Instructions

## 2024-01-12 NOTE — Progress Notes (Signed)
 Cardiology Office Note    Patient Name: Jacqueline Murphy Date of Encounter: 01/12/2024  Primary Care Provider:  Joyce Norleen BROCKS, MD Primary Cardiologist:  Candyce Reek, MD Primary Electrophysiologist: None   Past Medical History    Past Medical History:  Diagnosis Date   Atrial tachycardia    Cancer [facility]) 2008   LEFT Breast-Ductal CA in situ   Fibroid    GERD (gastroesophageal reflux disease)    Hypertension    LBBB (left bundle branch block)    Mitral regurgitation    NICM (nonischemic cardiomyopathy) (HCC)    a. dx 2011, EF 25-30% ? tachy induced. b. EF 40% in 2014. c. EF 30-35% in 01/2018, grade 1 DD.   Osteopenia     History of Present Illness  Jacqueline Murphy  is a 83year old female with a PMH of HFrEF, atrial tach, mitral regurgitation, NICM, HTN, LBBB, breast CA s/p mastectomy, who presents today for 1 year follow-up.   Jacqueline Murphy was last seen on 09/16/2022 for annual follow-up.  During her visit she was doing well with controlled blood pressures and staying active in her yard during the summer with no complaints of shortness of breath.  She underwent a repeat 2D echo for surveillance of cardiomyopathy that showed normal EF of 50-55% with no significant valve abnormalities and some septal wall abnormalities consistent with LBBB.  Jacqueline Murphy presents today for annual follow-up.  She reports since her previous visit that her blood pressure has been stable in the 120s. She manages her medications well, taking them as prescribed without any adverse effects. She is mindful of her diet, particularly with salt intake, to help manage her blood pressure. She remains active, engaging in activities such as yard work, and has no issues with shortness of breath or peripheral edema. No new complaints or concerns were reported. She has a history of a bundle branch block, but previous echocardiograms have shown normal heart function, with no impact on her heart's ability to pump  effectively. Her husband is not well and has been hospitalized recently, which has been challenging for her. She has support from family living nearby, which she finds helpful. She has a history of breast cancer surgery, which is why she prefers blood pressure measurements on her right arm. Patient denies chest pain, palpitations, dyspnea, PND, orthopnea, nausea, vomiting, dizziness, syncope, edema, weight gain, or early satiety.  Discussed the use of AI scribe software for clinical note transcription with the patient, who gave verbal consent to proceed.  History of Present Illness   Review of Systems  Please see the history of present illness.    All other systems reviewed and are otherwise negative except as noted above.  Physical Exam    Wt Readings from Last 3 Encounters:  09/16/22 153 lb 9.6 oz (69.7 kg)  08/06/21 157 lb (71.2 kg)  02/27/21 160 lb 9.6 oz (72.8 kg)   CD:Uyzmz were no vitals filed for this visit.,There is no height or weight on file to calculate BMI. GEN: Well nourished, well developed in no acute distress Neck: No JVD; No carotid bruits Pulmonary: Clear to auscultation without rales, wheezing or rhonchi  Cardiovascular: Normal rate. Regular rhythm. Normal S1. Normal S2.  Possible left carotid bruit present Murmurs: There is no murmur.  ABDOMEN: Soft, non-tender, non-distended EXTREMITIES:  No edema; No deformity   EKG/LABS/ Recent Cardiac Studies   ECG personally reviewed by me today -sinus bradycardia with left bundle branch block and rate of 58 bpm  with changes consistent with previous EKG.  Risk Assessment/Calculations:          Lab Results  Component Value Date   WBC 7.1 01/30/2019   HGB 12.3 01/30/2019   HCT 38.7 01/30/2019   MCV 90.0 01/30/2019   PLT 228 01/30/2019   Lab Results  Component Value Date   CREATININE 1.12 (H) 02/07/2019   BUN 13 02/07/2019   NA 140 02/07/2019   K 4.1 02/07/2019   CL 109 02/07/2019   CO2 23 02/07/2019   No  results found for: CHOL, HDL, LDLCALC, LDLDIRECT, TRIG, CHOLHDL  No results found for: HGBA1C Assessment & Plan    Assessment & Plan  1.HFrEF/NICM:  - 2D echo completed 09/2022 stable EF of 55% and stable valve function with soNo heart failure exacerbation symptoms. - Continue current heart failure management regimen.me wall motion abnormalities consistent with bundle branch block.  2.History of LBBB: - EKG with stable QRS at 120 ms no acute changes with no CHF symptoms noted. - Continue carvedilol  12.5 mg twice daily  3.Essential hypertension: -Blood pressure slightly elevated during visit, likely stress-related. Home readings in 120s, consistent with well-controlled status. - Recheck BP was normal at 136/68 - Continue current carvedilol  12.5 mg twice daily and Entresto  24/26 mg twice daily  4.Hyperlipidemia: -Patient's last LDL cholesterol was 62 she is planning to see Dr. Rexanne in the next few months - Continue Lipitor 10 mg daily  5.History of atrial tachycardia: -Patient currently on beta-blocker with no reports no recurrence of tachycardia or palpitations since previous visit. -Continue carvedilol  12.5 mg twice daily  6. Left carotid bruit: Audible bruit suggests possible carotid artery stenosis. - Order carotid ultrasound to assess for blockages. - Schedule ultrasound at her convenience.  Disposition: Follow-up with new cardiologist or APP in 6 months   Signed, Wyn Raddle, Jackee Shove, NP 01/12/2024, 8:14 AM [facility]

## 2024-01-12 NOTE — Telephone Encounter (Signed)
*  STAT* If patient is at the pharmacy, call can be transferred to refill team.   1. Which medications need to be refilled? (please list name of each medication and dose if known)   sacubitril -valsartan  (ENTRESTO ) 24-26 MG   2. Would you like to learn more about the convenience, safety, & potential cost savings by using the [facility]?   3. Are you open to using the [facility] (Type [facility]. ).  4. Which pharmacy/location (including street and city if local pharmacy) is medication to be sent to?  [id number] - [place], [place] - [address] AT [facility] OF [address]   5. Do they need a 30 day or 90 day supply?   90 day  Patient stated she has 2 days left of this medication.

## 2024-02-07 ENCOUNTER — Ambulatory Visit (HOSPITAL_COMMUNITY)
Admission: RE | Admit: 2024-02-07 | Discharge: 2024-02-07 | Disposition: A | Discharge status: Home / Self Care | Payer: MEDICARE | Source: Ambulatory Visit | Attending: Nurse Practitioner | Admitting: Nurse Practitioner

## 2024-02-07 DIAGNOSIS — I5020 Unspecified systolic (congestive) heart failure: Secondary | ICD-10-CM | POA: Diagnosis present

## 2024-02-07 DIAGNOSIS — R0989 Other specified symptoms and signs involving the circulatory and respiratory systems: Secondary | ICD-10-CM | POA: Diagnosis present

## 2024-02-07 DIAGNOSIS — I4719 Other supraventricular tachycardia: Secondary | ICD-10-CM | POA: Insufficient documentation

## 2024-02-07 DIAGNOSIS — I428 Other cardiomyopathies: Secondary | ICD-10-CM | POA: Diagnosis present

## 2024-02-07 DIAGNOSIS — I10 Essential (primary) hypertension: Secondary | ICD-10-CM | POA: Insufficient documentation

## 2024-02-07 DIAGNOSIS — I447 Left bundle-branch block, unspecified: Secondary | ICD-10-CM | POA: Insufficient documentation

## 2024-02-07 DIAGNOSIS — E782 Mixed hyperlipidemia: Secondary | ICD-10-CM | POA: Diagnosis present

## 2024-02-08 ENCOUNTER — Ambulatory Visit: Payer: Self-pay | Admitting: Nurse Practitioner
